# Patient Record
Sex: Female | Born: 1995
Health system: Southern US, Community
[De-identification: ages and names within clinical notes are randomized; demographics above are authoritative.]

## PROBLEM LIST (undated history)

## (undated) DIAGNOSIS — E282 Polycystic ovarian syndrome: Secondary | ICD-10-CM

## (undated) DIAGNOSIS — R000 Tachycardia, unspecified: Secondary | ICD-10-CM

## (undated) DIAGNOSIS — Z309 Encounter for contraceptive management, unspecified: Secondary | ICD-10-CM

## (undated) DIAGNOSIS — T7840XA Allergy, unspecified, initial encounter: Secondary | ICD-10-CM

## (undated) HISTORY — DX: Encounter for contraceptive management, unspecified: Z30.9

## (undated) HISTORY — DX: Allergy, unspecified, initial encounter: T78.40XA

---
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
## 2015-02-14 ENCOUNTER — Other Ambulatory Visit: Payer: Self-pay | Admitting: Adult Health

## 2015-04-15 ENCOUNTER — Ambulatory Visit (INDEPENDENT_AMBULATORY_CARE_PROVIDER_SITE_OTHER): Payer: 59 | Admitting: Adult Health

## 2015-04-15 ENCOUNTER — Encounter: Payer: Self-pay | Admitting: Adult Health

## 2015-04-15 VITALS — BP 140/72 | HR 104 | Ht 63.0 in | Wt 122.5 lb

## 2015-04-15 DIAGNOSIS — Z3041 Encounter for surveillance of contraceptive pills: Secondary | ICD-10-CM | POA: Diagnosis not present

## 2015-04-15 MED ORDER — NORETHIN ACE-ETH ESTRAD-FE 1-20 MG-MCG PO TABS: 1.0000 | ORAL_TABLET | Freq: Every day | ORAL | Status: DC

## 2015-04-15 NOTE — Patient Instructions (Signed)
Continue OCs Use condoms Pap and physical in 1 year

## 2015-04-15 NOTE — Progress Notes (Signed)
Subjective:     Patient ID: Vanessa Weaver, female   DOB: 1995/07/04, 20 y.o.   MRN: [id number]  HPI Vanessa Weaver is a 20 year old white female in for renewal of OCs, she is happy with them and has no complaints.  Review of Systems Patient denies any headaches, hearing loss, fatigue, blurred vision, shortness of breath, chest pain, abdominal pain, problems with bowel movements, urination, or intercourse. No joint pain or mood swings. Reviewed past medical,surgical, social and family history. Reviewed medications and allergies.     Objective:   Physical Exam BP 140/72 mmHg  Pulse 104  Ht  (1.6 m)  Wt 122 lb 8 oz (55.566 kg)  BMI 21.71 kg/m2  LMP 04/12/2015 Skin warm and dry. Neck: mid line trachea, normal thyroid, good ROM, no lymphadenopathy noted. Lungs: clear to ausculation bilaterally. Cardiovascular: regular rate and rhythm.    Assessment:     Contraceptive management    Plan:     Refilled Gildess FE 1-20 take 1 daily disp 1 pack with 12 refills Use condoms Return in 1 year for pap and physical

## 2016-04-18 ENCOUNTER — Ambulatory Visit (INDEPENDENT_AMBULATORY_CARE_PROVIDER_SITE_OTHER): Payer: 59 | Admitting: Adult Health

## 2016-04-18 ENCOUNTER — Encounter: Payer: Self-pay | Admitting: Adult Health

## 2016-04-18 VITALS — BP 118/70 | HR 74 | Ht 63.0 in | Wt 132.5 lb

## 2016-04-18 DIAGNOSIS — Z3041 Encounter for surveillance of contraceptive pills: Secondary | ICD-10-CM | POA: Diagnosis not present

## 2016-04-18 MED ORDER — NORETHIN ACE-ETH ESTRAD-FE 1-20 MG-MCG PO TABS: 1.0000 | ORAL_TABLET | Freq: Every day | ORAL | 12 refills | Status: DC

## 2016-04-18 NOTE — Patient Instructions (Signed)
Pap and physical in 6 months 

## 2016-04-18 NOTE — Progress Notes (Signed)
Subjective:     Patient ID: Vanessa GoingJessica Weaver, female   DOB: [birth year], 21 y.o.   MRN: [id number]  HPI Vanessa Weaver is a 21 year old white female, married in to get refills on birth control pills and she is happy with them.   Review of Systems Patient denies any headaches, hearing loss, fatigue, blurred vision, shortness of breath, chest pain, abdominal pain, problems with bowel movements, urination, or intercourse. No joint pain or mood swings. Reviewed past medical,surgical, social and family history. Reviewed medications and allergies.     Objective:   Physical Exam BP 118/70 (BP Location: Left Arm, Patient Position: Sitting, Cuff Size: Normal)   Pulse 74   Ht 5\' 3"  (1.6 m)   Wt 132 lb 8 oz (60.1 kg)   LMP 04/09/2016 (Approximate)   BMI 23.47 kg/m PHQ 2 score 0. Skin warm and dry.  Lungs: clear to ausculation bilaterally. Cardiovascular: regular rate and rhythm.   Continue OCs, will get pap and physical this summer.  Assessment:     1. Encounter for surveillance of contraceptive pills       Plan:     Meds ordered this encounter  Medications  . norethindrone-ethinyl estradiol (GILDESS FE 1/20) 1-20 MG-MCG tablet    Sig: Take 1 tablet by mouth daily.    Dispense:  28 tablet    Refill:  12    Order Specific Question:   Supervising Provider    Answer:   Vanessa Weaver [[id number]]  Pap and physical in 6 months

## 2016-10-19 ENCOUNTER — Encounter: Payer: Self-pay | Admitting: Adult Health

## 2016-10-19 ENCOUNTER — Ambulatory Visit (INDEPENDENT_AMBULATORY_CARE_PROVIDER_SITE_OTHER): Payer: 59 | Admitting: Adult Health

## 2016-10-19 ENCOUNTER — Other Ambulatory Visit (HOSPITAL_COMMUNITY)
Admission: RE | Admit: 2016-10-19 | Discharge: 2016-10-19 | Disposition: A | Discharge status: Home / Self Care | Payer: 59 | Source: Ambulatory Visit | Attending: Obstetrics & Gynecology | Admitting: Obstetrics & Gynecology

## 2016-10-19 VITALS — BP 100/60 | HR 76 | Ht 64.0 in | Wt 134.0 lb

## 2016-10-19 DIAGNOSIS — Z01419 Encounter for gynecological examination (general) (routine) without abnormal findings: Secondary | ICD-10-CM | POA: Diagnosis present

## 2016-10-19 DIAGNOSIS — Z3041 Encounter for surveillance of contraceptive pills: Secondary | ICD-10-CM | POA: Diagnosis not present

## 2016-10-19 MED ORDER — NORETHIN ACE-ETH ESTRAD-FE 1-20 MG-MCG PO TABS: 1.0000 | ORAL_TABLET | Freq: Every day | ORAL | 12 refills | Status: DC

## 2016-10-19 NOTE — Addendum Note (Signed)
Addended by: Federico FlakeNES, PEGGY A on: 10/19/2016 12:43 PM   Modules accepted: Orders

## 2016-10-19 NOTE — Progress Notes (Signed)
Patient ID: Vanessa Weaver, female   DOB: 07-03-1995, 21 y.o.   MRN: [id number] History of Present Illness:  Vanessa Weaver is a 21 year ld white female, married, G0P0, in for well woman gyn exam and first pap.   Current Medications, Allergies, Past Medical History, Past Surgical History, Family History and Social History were reviewed in Vanessa Weaver Link electronic medical record.     Review of Systems: Patient denies any headaches, hearing loss, fatigue, blurred vision, shortness of breath, chest pain, abdominal pain, problems with bowel movements, urination, or intercourse. No joint pain or mood swings.Happy with OCs    Physical Exam:BP 100/60 (BP Location: Left Arm, Patient Position: Sitting, Cuff Size: Small)   Pulse 76   Ht 5\' 4"  (1.626 m)   Wt 134 lb (60.8 kg)   LMP 09/13/2016   BMI 23.00 kg/m  General:  Well developed, well nourished, no acute distress Skin:  Warm and dry Neck:  Midline trachea, normal thyroid, good ROM, no lymphadenopathy Lungs; Clear to auscultation bilaterally Breast:  No dominant palpable mass, retraction, or nipple discharge Cardiovascular: Regular rate and rhythm Abdomen:  Soft, non tender, no hepatosplenomegaly Pelvic:  External genitalia is normal in appearance, no lesions.  The vagina is normal in appearance. Urethra has no lesions or masses. The cervix is smooth, pap with reflex HPV performed.  Uterus is felt to be normal size, shape, and contour.  No adnexal masses or tenderness noted.Bladder is non tender, no masses felt. Extremities/musculoskeletal:  No swelling or varicosities noted, no clubbing or cyanosis Psych:  No mood changes, alert and cooperative,seems happy PHQ 2 score 0.She declines GC/CHL today.  Impression: 1. Encounter for routine gynecological examination with Papanicolaou smear of cervix   2. Encounter for surveillance of contraceptive pills       Plan: Meds ordered this encounter  Medications  . norethindrone-ethinyl estradiol  (GILDESS FE 1/20) 1-20 MG-MCG tablet    Sig: Take 1 tablet by mouth daily.    Dispense:  28 tablet    Refill:  12    Order Specific Question:   Supervising Provider    Answer:   Duane LopeEURE, LUTHER H [[id number]]  physical in 1 year, pap in 3 if normal

## 2016-10-19 NOTE — Patient Instructions (Signed)
Physical in 1 year Pap in 3 if normal 

## 2016-10-23 LAB — CYTOLOGY - PAP: DIAGNOSIS: NEGATIVE

## 2016-11-24 ENCOUNTER — Telehealth: Payer: Self-pay | Admitting: Adult Health

## 2016-11-24 NOTE — Telephone Encounter (Signed)
Pt states pharmacy told her they couldn't fill birth control until November. I called [facility] and was told the prescription went through fine and pt can pick up later today with a $0 copay. Pt aware and voiced understanding. JSY

## 2016-11-24 NOTE — Telephone Encounter (Signed)
Patient called stating that she needs a new prescription sent to [facility]. Pt states that she went there to get a refill ad they told her to call the office and have [place] write a new one. Pt states that she runs out on Saturday. I let patient know that Victorino Dike is not in the office today. Pt would like another provider to try and fill it. Please contact pt

## 2017-08-23 DIAGNOSIS — J029 Acute pharyngitis, unspecified: Secondary | ICD-10-CM | POA: Diagnosis not present

## 2017-08-23 DIAGNOSIS — F418 Other specified anxiety disorders: Secondary | ICD-10-CM | POA: Diagnosis not present

## 2017-08-23 DIAGNOSIS — F419 Anxiety disorder, unspecified: Secondary | ICD-10-CM | POA: Diagnosis not present

## 2017-09-18 DIAGNOSIS — J180 Bronchopneumonia, unspecified organism: Secondary | ICD-10-CM | POA: Diagnosis not present

## 2017-10-03 DIAGNOSIS — L03019 Cellulitis of unspecified finger: Secondary | ICD-10-CM | POA: Diagnosis not present

## 2017-10-23 ENCOUNTER — Encounter: Payer: Self-pay | Admitting: Adult Health

## 2017-10-23 ENCOUNTER — Ambulatory Visit (INDEPENDENT_AMBULATORY_CARE_PROVIDER_SITE_OTHER): Payer: BLUE CROSS/BLUE SHIELD | Admitting: Adult Health

## 2017-10-23 VITALS — BP 123/79 | HR 97 | Ht 63.75 in | Wt 151.4 lb

## 2017-10-23 DIAGNOSIS — Z3041 Encounter for surveillance of contraceptive pills: Secondary | ICD-10-CM | POA: Diagnosis not present

## 2017-10-23 DIAGNOSIS — Z01419 Encounter for gynecological examination (general) (routine) without abnormal findings: Secondary | ICD-10-CM | POA: Diagnosis not present

## 2017-10-23 MED ORDER — NORETHIN ACE-ETH ESTRAD-FE 1-20 MG-MCG PO TABS: 1.0000 | ORAL_TABLET | Freq: Every day | ORAL | 4 refills | Status: DC

## 2017-10-23 NOTE — Progress Notes (Addendum)
Patient ID: Vanessa Weaver, female   DOB: 03/17/1995, 22 y.o.   MRN: [id number] History of Present Illness: Vanessa Weaver is a 22 year old white female in for a well woman gyn exam, and last pap was normal 10/19/16. She says she is good, no complaints.   Current Medications, Allergies, Past Medical History, Past Surgical History, Family History and Social History were [facility].     Review of Systems: Patient denies any headaches, hearing loss, fatigue, blurred vision, shortness of breath, chest pain, abdominal pain, problems with bowel movements, urination, or intercourse. No joint pain or mood swings.    Physical Exam:BP 123/79 (BP Location: Right Arm, Patient Position: Sitting, Cuff Size: Normal)   Pulse 97   Ht 5' 3.75" (1.619 m)   Wt 151 lb 6.4 oz (68.7 kg)   BMI 26.19 kg/m  General:  Well developed, well nourished, no acute distress Skin:  Warm and dry Neck:  Midline trachea, normal thyroid, good ROM, no lymphadenopathy Lungs; Clear to auscultation bilaterally Breast:  No dominant palpable mass, retraction, or nipple discharge,both nipples inverted Cardiovascular: Regular rate and rhythm Abdomen:  Soft, non tender, no hepatosplenomegaly Pelvic:  External genitalia is normal in appearance, no lesions.  The vagina is normal in appearance. Urethra has no lesions or masses. The cervix is nulliparous.   Uterus is felt to be normal size, shape, and contour.  No adnexal masses or tenderness noted.Bladder is non tender, no masses felt. Extremities/musculoskeletal:  No swelling or varicosities noted, no clubbing or cyanosis Psych:  No mood changes, alert and cooperative,seems happy PHQ 2 score 0. She declines STD testing.  Impression:   ICD-10-CM   1. Encounter for well woman exam with routine gynecological exam Z01.419   2. Encounter for surveillance of contraceptive pills Z30.41       Plan: Meds ordered this encounter  Medications  .  norethindrone-ethinyl estradiol (GILDESS FE 1/20) 1-20 MG-MCG tablet    Sig: Take 1 tablet by mouth daily.    Dispense:  84 tablet    Refill:  4    Order Specific Question:   Supervising Provider    Answer:   Lazaro ArmsEURE, LUTHER H [[id number]]  Physical in 1 years Pap in 2021

## 2018-03-28 DIAGNOSIS — Z008 Encounter for other general examination: Secondary | ICD-10-CM | POA: Diagnosis not present

## 2018-03-28 DIAGNOSIS — F17210 Nicotine dependence, cigarettes, uncomplicated: Secondary | ICD-10-CM | POA: Diagnosis not present

## 2018-04-28 DIAGNOSIS — R070 Pain in throat: Secondary | ICD-10-CM | POA: Diagnosis not present

## 2018-05-09 DIAGNOSIS — F17210 Nicotine dependence, cigarettes, uncomplicated: Secondary | ICD-10-CM | POA: Diagnosis not present

## 2018-05-09 DIAGNOSIS — Z716 Tobacco abuse counseling: Secondary | ICD-10-CM | POA: Diagnosis not present

## 2018-09-13 DIAGNOSIS — M546 Pain in thoracic spine: Secondary | ICD-10-CM | POA: Diagnosis not present

## 2018-10-21 ENCOUNTER — Other Ambulatory Visit: Payer: Self-pay

## 2018-10-21 DIAGNOSIS — Z20822 Contact with and (suspected) exposure to covid-19: Secondary | ICD-10-CM

## 2018-10-22 DIAGNOSIS — J301 Allergic rhinitis due to pollen: Secondary | ICD-10-CM | POA: Diagnosis not present

## 2018-10-22 DIAGNOSIS — J029 Acute pharyngitis, unspecified: Secondary | ICD-10-CM | POA: Diagnosis not present

## 2018-10-22 LAB — NOVEL CORONAVIRUS, NAA: SARS-CoV-2, NAA: NOT DETECTED

## 2018-12-28 DIAGNOSIS — Z760 Encounter for issue of repeat prescription: Secondary | ICD-10-CM | POA: Diagnosis not present

## 2019-02-04 ENCOUNTER — Ambulatory Visit
Admission: EM | Admit: 2019-02-04 | Discharge: 2019-02-04 | Disposition: A | Discharge status: Home / Self Care | Payer: BC Managed Care – PPO | Attending: Emergency Medicine | Admitting: Emergency Medicine

## 2019-02-04 ENCOUNTER — Other Ambulatory Visit: Payer: Self-pay

## 2019-02-04 DIAGNOSIS — Z20822 Contact with and (suspected) exposure to covid-19: Secondary | ICD-10-CM

## 2019-02-04 DIAGNOSIS — R112 Nausea with vomiting, unspecified: Secondary | ICD-10-CM

## 2019-02-04 DIAGNOSIS — A084 Viral intestinal infection, unspecified: Secondary | ICD-10-CM

## 2019-02-04 MED ORDER — ONDANSETRON 4 MG PO TBDP
4.0000 mg | ORAL_TABLET | Freq: Once | ORAL | Status: AC
  Administered 2019-02-04: 4 mg via ORAL

## 2019-02-04 MED ORDER — ONDANSETRON HCL 4 MG PO TABS: 4.0000 mg | ORAL_TABLET | Freq: Four times a day (QID) | ORAL | 0 refills | Status: DC

## 2019-02-04 NOTE — ED Provider Notes (Signed)
[facility]   [id number] 02/04/19 Arrival Time: 1234  CC: nausea, vomiting, diarrhea  SUBJECTIVE:  Vanessa Weaver is a 23 y.o. female who presents with complaint of nausea, 1 episode of vomiting, and greater than 5 episodes of watery/ loose stools that began 1 day ago.  Denies a precipitating event, trauma, close contacts with similar symptoms, recent travel or antibiotic use.  Admits to associated abdominal discomfort.  Has not tried OTC medications.  Denies alleviating or aggravating factors.  Has been able to keep water and liquids down without difficulty.  Denies fever, chills, chest pain, SOB, constipation, hematochezia, melena, dysuria, difficulty urinating, increased frequency or urgency, flank pain, loss of bowel or bladder function.  Patient's last menstrual period was 01/16/2019. Trying to get pregnant.  Took pregnancy test yesterday and was negative.    ROS: As per HPI.  All other pertinent ROS negative.     Past Medical History:  Diagnosis Date  . Allergy   . Contraceptive management 10/28/2013   History reviewed. No pertinent surgical history. No Known Allergies No current facility-administered medications on file prior to encounter.    Current Outpatient Medications on File Prior to Encounter  Medication Sig Dispense Refill  . hydrOXYzine (ATARAX/VISTARIL) 25 MG tablet Take 25 mg by mouth as needed.   2  . sertraline (ZOLOFT) 50 MG tablet TK 1 T PO QD UTD  6  . [DISCONTINUED] norethindrone-ethinyl estradiol (GILDESS FE 1/20) 1-20 MG-MCG tablet Take 1 tablet by mouth daily. 84 tablet 4   Social History   Socioeconomic History  . Marital status: Single    Spouse name: Not on file  . Number of children: Not on file  . Years of education: Not on file  . Highest education level: Not on file  Occupational History  . Not on file  Social Needs  . Financial resource strain: Not on file  . Food insecurity    Worry: Not on file    Inability: Not on file  .  Transportation needs    Medical: Not on file    Non-medical: Not on file  Tobacco Use  . Smoking status: Current Every Day Smoker    Years: 3.00    Types: E-cigarettes  . Smokeless tobacco: Never Used  Substance and Sexual Activity  . Alcohol use: Yes    Comment: occ  . Drug use: No  . Sexual activity: Yes    Birth control/protection: Pill  Lifestyle  . Physical activity    Days per week: Not on file    Minutes per session: Not on file  . Stress: Not on file  Relationships  . Social Musician on phone: Not on file    Gets together: Not on file    Attends religious service: Not on file    Active member of club or organization: Not on file    Attends meetings of clubs or organizations: Not on file    Relationship status: Not on file  . Intimate partner violence    Fear of current or ex partner: Not on file    Emotionally abused: Not on file    Physically abused: Not on file    Forced sexual activity: Not on file  Other Topics Concern  . Not on file  Social History Narrative  . Not on file   Family History  Problem Relation Age of Onset  . Hypertension Father   . Stroke Father   . Kidney failure Father   .  Other Father        liver failure  . Cancer Maternal Grandmother        stomach  . Diabetes Maternal Grandfather   . Heart disease Maternal Grandfather   . Alzheimer's disease Paternal Grandfather      OBJECTIVE:  Vitals:   02/04/19 1248  BP: 125/79  Pulse: 100  Resp: 18  Temp: 98 F (36.7 C)  SpO2: 98%    General appearance: Alert; NAD HEENT: NCAT.  PERRL, EOMI grossly; Oropharynx clear.  Lungs: clear to auscultation bilaterally without adventitious breath sounds Heart: regular rate and rhythm.   Abdomen: soft, non-distended; normal active bowel sounds; non-tender to light and deep palpation; nontender at McBurney's point; negative Murphy's sign; no guarding Extremities: no edema; symmetrical with no gross deformities Skin: warm and dry  Neurologic: normal gait Psychological: alert and cooperative; normal mood and affect  ASSESSMENT & PLAN:  1. Viral gastroenteritis   2. Suspected COVID-19 virus infection   3. Non-intractable vomiting with nausea, unspecified vomiting type     Meds ordered this encounter  Medications  . ondansetron (ZOFRAN) 4 MG tablet    Sig: Take 1 tablet (4 mg total) by mouth every 6 (six) hours.    Dispense:  12 tablet    Refill:  0    Order Specific Question:   Supervising Provider    Answer:   Raylene Everts [[id number]]  . ondansetron (ZOFRAN-ODT) disintegrating tablet 4 mg    Declines pregnancy test Declines COVID.  Will go to drive-thru or return if COVID test needed for work  In the meantime: You should remain isolated in your home for 10 days from symptom onset AND greater than 72 hours after symptoms resolution (absence of fever without the use of fever-reducing medication and improvement in respiratory symptoms), whichever is longer Get plenty of rest and push fluids.  You may supplement with OTC pedialyte as needed Zofran prescribed for nausea as needed Use OTC medications like ibuprofen or tylenol as needed fever or pain Follow up with PCP as needed Call or go to the ED if you have any new or worsening symptoms such as fever, cough, shortness of breath, chest tightness, chest pain, turning blue, changes in mental status, profuse watery diarrhea, persistent vomiting, inability to keep fluids down, medication does not relieve symptoms, etc...   Reviewed expectations re: course of current medical issues. Questions answered. Outlined signs and symptoms indicating need for more acute intervention. Patient verbalized understanding. After Visit Summary given.   Lestine Box, PA-C 02/04/19 1326

## 2019-02-04 NOTE — ED Triage Notes (Signed)
Pt presents with c/o nausea and some dry heaves that stared yesterday , pt also having diarrhea

## 2019-02-04 NOTE — Discharge Instructions (Signed)
Declines pregnancy test Declines COVID.  Will go to drive-thru or return if COVID test needed for work  In the meantime: You should remain isolated in your home for 10 days from symptom onset AND greater than 72 hours after symptoms resolution (absence of fever without the use of fever-reducing medication and improvement in respiratory symptoms), whichever is longer Get plenty of rest and push fluids.  You may supplement with OTC pedialyte as needed Zofran prescribed for nausea as needed Use OTC medications like ibuprofen or tylenol as needed fever or pain Follow up with PCP as needed Call or go to the ED if you have any new or worsening symptoms such as fever, cough, shortness of breath, chest tightness, chest pain, turning blue, changes in mental status, profuse watery diarrhea, persistent vomiting, inability to keep fluids down, medication does not relieve symptoms, etc..Marland Kitchen

## 2019-02-05 ENCOUNTER — Ambulatory Visit
Admission: EM | Admit: 2019-02-05 | Discharge: 2019-02-05 | Disposition: A | Discharge status: Home / Self Care | Payer: BC Managed Care – PPO | Attending: Emergency Medicine | Admitting: Emergency Medicine

## 2019-02-05 DIAGNOSIS — Z20828 Contact with and (suspected) exposure to other viral communicable diseases: Secondary | ICD-10-CM | POA: Diagnosis not present

## 2019-02-05 DIAGNOSIS — Z20822 Contact with and (suspected) exposure to covid-19: Secondary | ICD-10-CM

## 2019-02-05 NOTE — ED Triage Notes (Signed)
Pt here for covid test after initial visit

## 2019-02-07 LAB — NOVEL CORONAVIRUS, NAA: SARS-CoV-2, NAA: NOT DETECTED

## 2019-02-16 DIAGNOSIS — J069 Acute upper respiratory infection, unspecified: Secondary | ICD-10-CM | POA: Diagnosis not present

## 2019-04-23 ENCOUNTER — Other Ambulatory Visit: Payer: Self-pay

## 2019-04-23 ENCOUNTER — Other Ambulatory Visit (HOSPITAL_COMMUNITY)
Admission: RE | Admit: 2019-04-23 | Discharge: 2019-04-23 | Disposition: A | Discharge status: Home / Self Care | Payer: BC Managed Care – PPO | Source: Ambulatory Visit | Attending: Adult Health | Admitting: Adult Health

## 2019-04-23 ENCOUNTER — Encounter: Payer: Self-pay | Admitting: Adult Health

## 2019-04-23 ENCOUNTER — Ambulatory Visit (INDEPENDENT_AMBULATORY_CARE_PROVIDER_SITE_OTHER): Payer: BC Managed Care – PPO | Admitting: Adult Health

## 2019-04-23 VITALS — BP 141/87 | HR 114 | Ht 62.5 in | Wt 143.5 lb

## 2019-04-23 DIAGNOSIS — Z01419 Encounter for gynecological examination (general) (routine) without abnormal findings: Secondary | ICD-10-CM | POA: Diagnosis not present

## 2019-04-23 DIAGNOSIS — Z319 Encounter for procreative management, unspecified: Secondary | ICD-10-CM | POA: Insufficient documentation

## 2019-04-23 NOTE — Progress Notes (Signed)
Patient ID: Vanessa Weaver, female   DOB: 09-Aug-1995, 24 y.o.   MRN: [id number] History of Present Illness: Vanessa Weaver is a 24 year old white female, married, G0P0, off birth control about a year now, would like to get pregnant.    Current Medications, Allergies, Past Medical History, Past Surgical History, Family History and Social History were reviewed in [employer] record.     Review of Systems: Patient denies any headaches, hearing loss, fatigue, blurred vision, shortness of breath, chest pain, abdominal pain, problems with bowel movements, urination, or intercourse. No joint pain or mood swings. Periods are regular    Physical Exam:BP (!) 141/87 (BP Location: Left Arm, Patient Position: Sitting, Cuff Size: Normal)   Pulse (!) 114   Ht 5' 2.5" (1.588 m)   Wt 143 lb 8 oz (65.1 kg)   LMP 04/15/2019   BMI 25.83 kg/m  General:  Well developed, well nourished, no acute distress Skin:  Warm and dry,has lots of tattoos Neck:  Midline trachea, normal thyroid, good ROM, no lymphadenopathy Lungs; Clear to auscultation bilaterally Breast:  No dominant palpable mass, retraction, or nipple discharge,both nipples inverted  Cardiovascular: Regular rate and rhythm Abdomen:  Soft, non tender, no hepatosplenomegaly Pelvic:  External genitalia is normal in appearance, no lesions.  The vagina is normal in appearance. Urethra has no lesions or masses. The cervix is nulliparous, pap with GC/CHL performed.  Uterus is felt to be normal size, shape, and contour.  No adnexal masses or tenderness noted.Bladder is non tender, no masses felt. Extremities/musculoskeletal:  No swelling or varicosities noted, no clubbing or cyanosis Psych:  No mood changes, alert and cooperative,seems happy Fall risk is low PHQ 2 score is 0. Examination chaperoned by Stoney Bang LPN  Impression and plan:  1. Encounter for gynecological examination with Papanicolaou smear of cervix Pap sent  Physical  in 1 year Pap in 3 if normal  2. Patient desires pregnancy Continue PNV Have sex every other day 7-24 Pt aware could take 6-18 months of active trying Pee before sex and lay there after

## 2019-04-25 LAB — CYTOLOGY - PAP
Chlamydia: NEGATIVE
Comment: NEGATIVE
Comment: NORMAL
Diagnosis: NEGATIVE
Neisseria Gonorrhea: NEGATIVE

## 2019-05-08 DIAGNOSIS — H1030 Unspecified acute conjunctivitis, unspecified eye: Secondary | ICD-10-CM | POA: Diagnosis not present

## 2019-05-09 ENCOUNTER — Ambulatory Visit
Admission: EM | Admit: 2019-05-09 | Discharge: 2019-05-09 | Disposition: A | Discharge status: Home / Self Care | Payer: BC Managed Care – PPO | Attending: Emergency Medicine | Admitting: Emergency Medicine

## 2019-05-09 ENCOUNTER — Other Ambulatory Visit: Payer: Self-pay

## 2019-05-09 DIAGNOSIS — M25511 Pain in right shoulder: Secondary | ICD-10-CM | POA: Diagnosis not present

## 2019-05-09 DIAGNOSIS — G8929 Other chronic pain: Secondary | ICD-10-CM

## 2019-05-09 MED ORDER — IBUPROFEN 800 MG PO TABS: 800.0000 mg | ORAL_TABLET | Freq: Three times a day (TID) | ORAL | 0 refills | Status: DC

## 2019-05-09 MED ORDER — PREDNISONE 10 MG PO TABS: 20.0000 mg | ORAL_TABLET | Freq: Every day | ORAL | 0 refills | Status: DC

## 2019-05-09 NOTE — Discharge Instructions (Signed)
Advised patient to take medication as prescribed Follow RICE instruction as attached Follow-up with primary care To return or go to ED for worsening of symptoms

## 2019-05-09 NOTE — ED Triage Notes (Signed)
Pt presents with c/o right shoulder pain that she has had for over a year, unknown if injury

## 2019-05-09 NOTE — ED Provider Notes (Signed)
[facility]    CSN: [id number] Arrival date & time: 05/09/19  0903      History   Chief Complaint Chief Complaint  Patient presents with  . Shoulder Pain    HPI Vanessa Weaver is a 24 y.o. female.   Who presented to the urgent care for complaint of right shoulder pain for  more than a year.  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  She localizes the pain to the right shoulder around her shoulder blade.  She describes the pain as intermittent and achy.  Rated at 5 on a scale of 1-10.  She reports she visited urgent care on Friday the past and was prescribed ibuprofen with mild relief.  Her symptoms are made worse with ROM.  Denies trauma, injury, fall or accident.     Past Medical History:  Diagnosis Date  . Allergy   . Contraceptive management 10/28/2013    Patient Active Problem List   Diagnosis Date Noted  . Encounter for gynecological examination with Papanicolaou smear of cervix 04/23/2019  . Patient desires pregnancy 04/23/2019  . Encounter for routine gynecological examination with Papanicolaou smear of cervix 10/19/2016  . Encounter for surveillance of contraceptive pills 04/18/2016  . Contraceptive management 10/28/2013    History reviewed. No pertinent surgical history.  OB History    Gravida  0   Para      Term      Preterm      AB      Living        SAB      TAB      Ectopic      Multiple      Live Births               Home Medications    Prior to Admission medications   Medication Sig Start Date End Date Taking? Authorizing Provider  hydrOXYzine (ATARAX/VISTARIL) 25 MG tablet Take 25 mg by mouth as needed.  08/23/17   [provider]  ibuprofen (ADVIL) 800 MG tablet Take 1 tablet (800 mg total) by mouth 3 (three) times daily. 05/09/19   Conswella Bruney, Zachery Dakins, FNP  predniSONE (DELTASONE) 10 MG tablet Take 2 tablets (20 mg total) by mouth daily. 05/09/19   Kamyiah Colantonio, Zachery Dakins, FNP  Prenatal Vit-Fe Fumarate-FA (PRENATAL VITAMIN PO) Take by mouth daily.    [provider]  sertraline (ZOLOFT) 50 MG tablet TK 1 T PO QD UTD 09/25/17   [provider]  norethindrone-ethinyl estradiol (GILDESS FE 1/20) 1-20 MG-MCG tablet Take 1 tablet by mouth daily. 10/23/17 02/04/19  Adline Potter, NP    Family History Family History  Problem Relation Age of Onset  . Hypertension Father   . Stroke Father   . Kidney failure Father   . Other Father        liver failure  . Cancer Maternal Grandmother        stomach  . Diabetes Maternal Grandfather   . Heart disease Maternal Grandfather   . Alzheimer's disease Paternal Grandfather     Social History Social History   Tobacco Use  . Smoking status: Former Smoker    Years: 3.00    Types: E-cigarettes  . Smokeless tobacco: Never Used  Substance Use Topics  . Alcohol use: Yes    Comment: occ  . Drug use: No     Allergies   Patient has no known allergies.  Review of Systems Review of Systems  Constitutional: Negative.   Respiratory: Negative.   Cardiovascular: Negative.   Musculoskeletal:       Shoulder pain  All other systems reviewed and are negative.    Physical Exam Triage Vital Signs ED Triage Vitals  Enc Vitals Group     BP 05/09/19 0912 124/81     Pulse Rate 05/09/19 0912 (!) 112     Resp 05/09/19 0912 18     Temp 05/09/19 0912 98.3 F (36.8 C)     Temp src --      SpO2 05/09/19 0912 99 %     Weight --      Height --      Head Circumference --      Peak Flow --      Pain Score 05/09/19 0910 5     Pain Loc --      Pain Edu? --      Excl. in GC? --    No data found.  Updated Vital Signs BP 124/81   Pulse (!) 112   Temp 98.3 F (36.8 C)   Resp 18   LMP 04/15/2019   SpO2 99%   Visual Acuity Right Eye Distance:   Left Eye Distance:   Bilateral Distance:    Right Eye Near:   Left Eye Near:    Bilateral Near:     Physical Exam Vitals and nursing note  reviewed.  Constitutional:      General: She is not in acute distress.    Appearance: Normal appearance. She is normal weight. She is not ill-appearing or toxic-appearing.  Cardiovascular:     Rate and Rhythm: Normal rate and regular rhythm.     Pulses: Normal pulses.     Heart sounds: Normal heart sounds. No murmur. No gallop.   Pulmonary:     Effort: Pulmonary effort is normal. No respiratory distress.     Breath sounds: Normal breath sounds. No stridor. No wheezing, rhonchi or rales.  Chest:     Chest wall: No tenderness.  Musculoskeletal:        General: Tenderness present. No swelling, deformity or signs of injury. Normal range of motion.     Right shoulder: Tenderness present. No deformity, effusion, laceration, bony tenderness or crepitus. Normal range of motion.     Left shoulder: Normal.     Right lower leg: No edema.     Left lower leg: No edema.     Comments: The right shoulder is without any obvious asymmetry or deformity when compared to the left.  No erythema, atrophy or obvious deformity.  No surface trauma, open wound present.  Tenderness present on range of motion.  Neurological:     General: No focal deficit present.     Mental Status: She is alert and oriented to person, place, and time.     Cranial Nerves: No cranial nerve deficit.     Sensory: No sensory deficit.     Motor: No weakness.     Coordination: Coordination normal.     Gait: Gait normal.     Deep Tendon Reflexes: Reflexes normal.      UC Treatments / Results  Labs (all labs ordered are listed, but only abnormal results are displayed) Labs Reviewed - No data to display  EKG   Radiology No results found.  Procedures Procedures (including critical care time)  Medications Ordered in UC Medications - No data to display  Initial Impression / Assessment and Plan /  UC Course  I have reviewed the triage vital signs and the nursing notes.  Pertinent labs & imaging results that were available  during my care of the patient were reviewed by me and considered in my medical decision making (see chart for details).    Patient stable for discharge. Ibuprofen was prescribed for pain Prednisone as prescribed for inflammation Follow-up with primary care Return for worsening symptoms  Final Clinical Impressions(s) / UC Diagnoses   Final diagnoses:  Chronic right shoulder pain     Discharge Instructions     Advised patient to take medication as prescribed Follow RICE instruction as attached Follow-up with primary care To return or go to ED for worsening of symptoms    ED Prescriptions    Medication Sig Dispense Auth. Provider   ibuprofen (ADVIL) 800 MG tablet Take 1 tablet (800 mg total) by mouth 3 (three) times daily. 42 tablet Senan Urey S, FNP   predniSONE (DELTASONE) 10 MG tablet Take 2 tablets (20 mg total) by mouth daily. 15 tablet Delorus Langwell, Darrelyn Hillock, FNP     PDMP not reviewed this encounter.   Emerson Monte, [place] 05/09/19 [phone]

## 2019-06-26 DIAGNOSIS — Z760 Encounter for issue of repeat prescription: Secondary | ICD-10-CM | POA: Diagnosis not present

## 2019-07-30 DIAGNOSIS — K529 Noninfective gastroenteritis and colitis, unspecified: Secondary | ICD-10-CM | POA: Diagnosis not present

## 2019-08-29 DIAGNOSIS — H6990 Unspecified Eustachian tube disorder, unspecified ear: Secondary | ICD-10-CM | POA: Diagnosis not present

## 2019-08-29 DIAGNOSIS — R0982 Postnasal drip: Secondary | ICD-10-CM | POA: Diagnosis not present

## 2019-08-29 DIAGNOSIS — J069 Acute upper respiratory infection, unspecified: Secondary | ICD-10-CM | POA: Diagnosis not present

## 2019-08-29 DIAGNOSIS — R0981 Nasal congestion: Secondary | ICD-10-CM | POA: Diagnosis not present

## 2019-09-21 ENCOUNTER — Other Ambulatory Visit: Payer: Self-pay

## 2019-09-21 ENCOUNTER — Ambulatory Visit
Admission: EM | Admit: 2019-09-21 | Discharge: 2019-09-21 | Disposition: A | Discharge status: Home / Self Care | Payer: BC Managed Care – PPO | Attending: Emergency Medicine | Admitting: Emergency Medicine

## 2019-09-21 ENCOUNTER — Encounter: Payer: Self-pay | Admitting: Emergency Medicine

## 2019-09-21 ENCOUNTER — Ambulatory Visit (INDEPENDENT_AMBULATORY_CARE_PROVIDER_SITE_OTHER): Payer: BC Managed Care – PPO

## 2019-09-21 DIAGNOSIS — J069 Acute upper respiratory infection, unspecified: Secondary | ICD-10-CM | POA: Diagnosis not present

## 2019-09-21 DIAGNOSIS — R05 Cough: Secondary | ICD-10-CM | POA: Diagnosis not present

## 2019-09-21 DIAGNOSIS — R0989 Other specified symptoms and signs involving the circulatory and respiratory systems: Secondary | ICD-10-CM

## 2019-09-21 DIAGNOSIS — Z760 Encounter for issue of repeat prescription: Secondary | ICD-10-CM | POA: Diagnosis not present

## 2019-09-21 MED ORDER — SERTRALINE HCL 50 MG PO TABS: ORAL_TABLET | ORAL | 1 refills | Status: DC

## 2019-09-21 MED ORDER — BENZONATATE 100 MG PO CAPS: 100.0000 mg | ORAL_CAPSULE | Freq: Three times a day (TID) | ORAL | 0 refills | Status: DC

## 2019-09-21 NOTE — ED Triage Notes (Signed)
Was treated for sinus infection 2 weeks ago.  Finished antibiotics.  Still having chest congestion and sore throat.

## 2019-09-21 NOTE — Discharge Instructions (Signed)
Chest x-ray negative for cardiopulmonary disease Symptoms most likely viral Get plenty of rest and push fluids Tessalon Perles prescribed for cough Zyrtec for nasal congestion, runny nose, and/or sore throat Flonase for nasal congestion and runny nose Use medications daily for symptom relief Use OTC medications like ibuprofen or tylenol as needed fever or pain Call or go to the ED if you have any new or worsening symptoms such as fever, worsening cough, shortness of breath, chest tightness, chest pain, turning blue, changes in mental status, etc...   Sertraline refill Follow up with PCP for future refills

## 2019-09-21 NOTE — ED Provider Notes (Signed)
[facility]   [id number] 09/21/19 Arrival Time: 1337   CC: URI  SUBJECTIVE: History from: patient.  Vanessa Weaver is a 24 y.o. female who presents with runny nose, sore throat, and dry cough x 1 week.  Denies sick exposure to COVID, flu or strep.  Denies recent travel.  Recently treated for sinus infection with antibiotic, reports temporary relief in symptoms.   Denies aggravating factors.  Reports previous symptoms in the past with walking pneumonia.   Denies fever, chills, fatigue, sinus pain, SOB, wheezing, chest pain, nausea, changes in bowel or bladder habits.    Also requested medication refill for sertraline for depression.  Does not have a PCP.  Last refill done at Urgent care in .    ROS: As per HPI.  All other pertinent ROS negative.     Past Medical History:  Diagnosis Date  . Allergy   . Contraceptive management 10/28/2013   History reviewed. No pertinent surgical history. No Known Allergies No current facility-administered medications on file prior to encounter.   Current Outpatient Medications on File Prior to Encounter  Medication Sig Dispense Refill  . hydrOXYzine (ATARAX/VISTARIL) 25 MG tablet Take 25 mg by mouth as needed.   2  . Prenatal Vit-Fe Fumarate-FA (PRENATAL VITAMIN PO) Take by mouth daily.    . [DISCONTINUED] norethindrone-ethinyl estradiol (GILDESS FE 1/20) 1-20 MG-MCG tablet Take 1 tablet by mouth daily. 84 tablet 4   Social History   Socioeconomic History  . Marital status: Married    Spouse name: Not on file  . Number of children: Not on file  . Years of education: Not on file  . Highest education level: Not on file  Occupational History  . Not on file  Tobacco Use  . Smoking status: Former Smoker    Years: 3.00    Types: E-cigarettes  . Smokeless tobacco: Never Used  Vaping Use  . Vaping Use: Former  Substance and Sexual Activity  . Alcohol use: Yes    Comment: occ  . Drug use: No  . Sexual activity: Yes     Birth control/protection: None  Other Topics Concern  . Not on file  Social History Narrative  . Not on file   Social Determinants of Health   Financial Resource Strain:   . Difficulty of Paying Living Expenses:   Food Insecurity:   . Worried About Programme researcher, broadcasting/film/video in the Last Year:   . Barista in the Last Year:   Transportation Needs:   . Freight forwarder (Medical):   Marland Kitchen Lack of Transportation (Non-Medical):   Physical Activity:   . Days of Exercise per Week:   . Minutes of Exercise per Session:   Stress:   . Feeling of Stress :   Social Connections:   . Frequency of Communication with Friends and Family:   . Frequency of Social Gatherings with Friends and Family:   . Attends Religious Services:   . Active Member of Clubs or Organizations:   . Attends Banker Meetings:   Marland Kitchen Marital Status:   Intimate Partner Violence:   . Fear of Current or Ex-Partner:   . Emotionally Abused:   Marland Kitchen Physically Abused:   . Sexually Abused:    Family History  Problem Relation Age of Onset  . Hypertension Father   . Stroke Father   . Kidney failure Father   . Other Father        liver failure  . Cancer Maternal  Grandmother        stomach  . Diabetes Maternal Grandfather   . Heart disease Maternal Grandfather   . Alzheimer's disease Paternal Grandfather     OBJECTIVE:  Vitals:   09/21/19 1346 09/21/19 1347  BP: 108/74   Pulse: 95   Resp: 18   Temp: 98 F (36.7 C)   TempSrc: Oral   SpO2: 97%   Weight:  140 lb (63.5 kg)    General appearance: alert; mildly fatigued, nontoxic; speaking in full sentences and tolerating own secretions HEENT: NCAT; Ears: EACs clear, TMs pearly gray; Eyes: PERRL.  EOM grossly intact. Nose: nares patent without rhinorrhea, Throat: oropharynx clear, tonsils non erythematous or enlarged, uvula midline  Neck: supple without LAD Lungs: unlabored respirations, symmetrical air entry; cough: mild to moderate; no respiratory  distress; CTAB Heart: regular rate and rhythm.  Skin: warm and dry Psychological: alert and cooperative; normal mood and affect   DIAGNOSTIC STUDIES:  DG Chest 2 View  Result Date: 09/21/2019 CLINICAL DATA:  Cough and chest congestion EXAM: CHEST - 2 VIEW COMPARISON:  None. FINDINGS: The heart size and mediastinal contours are within normal limits. Both lungs are clear. The visualized skeletal structures are unremarkable. IMPRESSION: No active cardiopulmonary disease. Electronically Signed   By: Gerome Sam III M.D   On: 09/21/2019 14:13    X-rays negative for cardiopulmonary disease  I have reviewed the x-rays myself and the radiologist interpretation. I am in agreement with the radiologist interpretation.     ASSESSMENT & PLAN:  1. Viral URI with cough   2. Medication refill     Meds ordered this encounter  Medications  . sertraline (ZOLOFT) 50 MG tablet    Sig: TK 1 T PO QD UTD    Dispense:  30 tablet    Refill:  1    Order Specific Question:   Supervising Provider    Answer:   Eustace Moore [[id number]]  . benzonatate (TESSALON) 100 MG capsule    Sig: Take 1 capsule (100 mg total) by mouth every 8 (eight) hours.    Dispense:  21 capsule    Refill:  0    Order Specific Question:   Supervising Provider    Answer:   Eustace Moore [[id number]]   Chest x-ray negative for cardiopulmonary disease Symptoms most likely viral Get plenty of rest and push fluids Tessalon Perles prescribed for cough Zyrtec for nasal congestion, runny nose, and/or sore throat Flonase for nasal congestion and runny nose Use medications daily for symptom relief Use OTC medications like ibuprofen or tylenol as needed fever or pain Call or go to the ED if you have any new or worsening symptoms such as fever, worsening cough, shortness of breath, chest tightness, chest pain, turning blue, changes in mental status, etc...   Sertraline refill Follow up with PCP for future refills  Reviewed  expectations re: course of current medical issues. Questions answered. Outlined signs and symptoms indicating need for more acute intervention. Patient verbalized understanding. After Visit Summary given.         Rennis Harding, PA-C 09/21/19 1442

## 2019-09-24 LAB — CULTURE, GROUP A STREP (THRC)

## 2019-12-10 ENCOUNTER — Ambulatory Visit
Admission: EM | Admit: 2019-12-10 | Discharge: 2019-12-10 | Disposition: A | Discharge status: Home / Self Care | Payer: BC Managed Care – PPO | Attending: Emergency Medicine | Admitting: Emergency Medicine

## 2019-12-10 ENCOUNTER — Other Ambulatory Visit: Payer: Self-pay

## 2019-12-10 DIAGNOSIS — R112 Nausea with vomiting, unspecified: Secondary | ICD-10-CM | POA: Diagnosis not present

## 2019-12-10 MED ORDER — ONDANSETRON 4 MG PO TBDP: 4.0000 mg | ORAL_TABLET | Freq: Three times a day (TID) | ORAL | 0 refills | Status: DC | PRN

## 2019-12-10 NOTE — ED Triage Notes (Signed)
Pt presents with c/o vomiting that began last night , no other symptoms, denies pain

## 2019-12-10 NOTE — Discharge Instructions (Addendum)

## 2019-12-10 NOTE — ED Provider Notes (Addendum)
[facility]   [id number] 12/10/19 Arrival Time: 1105  CC: ABDOMINAL DISCOMFORT  SUBJECTIVE:  Vanessa Weaver is a 24 y.o. female who presents to the urgent care for complaint of vomiting that started last night.. Her husband with the same symptom after eating a red meat barbecue.  Denies abdominal/flank pain.  Has not tried any OTC medication.  Denies alleviating or aggravating factors.  Denies similar symptoms in the past.  Denies fever, chills, appetite changes, weight changes,  chest pain, SOB, diarrhea, constipation, hematochezia, melena, dysuria, difficulty urinating, increased frequency or urgency, flank pain, loss of bowel or bladder function, vaginal discharge, vaginal odor, vaginal bleeding, dyspareunia, pelvic pain.     Patient's last menstrual period was 12/09/2019.  ROS: As per HPI.  All other pertinent ROS negative.     Past Medical History:  Diagnosis Date  . Allergy   . Contraceptive management 10/28/2013   No past surgical history on file. No Known Allergies No current facility-administered medications on file prior to encounter.   Current Outpatient Medications on File Prior to Encounter  Medication Sig Dispense Refill  . benzonatate (TESSALON) 100 MG capsule Take 1 capsule (100 mg total) by mouth every 8 (eight) hours. 21 capsule 0  . hydrOXYzine (ATARAX/VISTARIL) 25 MG tablet Take 25 mg by mouth as needed.   2  . Prenatal Vit-Fe Fumarate-FA (PRENATAL VITAMIN PO) Take by mouth daily.    . sertraline (ZOLOFT) 50 MG tablet TK 1 T PO QD UTD 30 tablet 1  . [DISCONTINUED] norethindrone-ethinyl estradiol (GILDESS FE 1/20) 1-20 MG-MCG tablet Take 1 tablet by mouth daily. 84 tablet 4   Social History   Socioeconomic History  . Marital status: Married    Spouse name: Not on file  . Number of children: Not on file  . Years of education: Not on file  . Highest education level: Not on file  Occupational History  . Not on file  Tobacco Use  . Smoking status:  Former Smoker    Years: 3.00    Types: E-cigarettes  . Smokeless tobacco: Never Used  Vaping Use  . Vaping Use: Former  Substance and Sexual Activity  . Alcohol use: Yes    Comment: occ  . Drug use: No  . Sexual activity: Yes    Birth control/protection: None  Other Topics Concern  . Not on file  Social History Narrative  . Not on file   Social Determinants of Health   Financial Resource Strain:   . Difficulty of Paying Living Expenses: Not on file  Food Insecurity:   . Worried About Programme researcher, broadcasting/film/video in the Last Year: Not on file  . Ran Out of Food in the Last Year: Not on file  Transportation Needs:   . Lack of Transportation (Medical): Not on file  . Lack of Transportation (Non-Medical): Not on file  Physical Activity:   . Days of Exercise per Week: Not on file  . Minutes of Exercise per Session: Not on file  Stress:   . Feeling of Stress : Not on file  Social Connections:   . Frequency of Communication with Friends and Family: Not on file  . Frequency of Social Gatherings with Friends and Family: Not on file  . Attends Religious Services: Not on file  . Active Member of Clubs or Organizations: Not on file  . Attends Banker Meetings: Not on file  . Marital Status: Not on file  Intimate Partner Violence:   . Fear of  Current or Ex-Partner: Not on file  . Emotionally Abused: Not on file  . Physically Abused: Not on file  . Sexually Abused: Not on file   Family History  Problem Relation Age of Onset  . Hypertension Father   . Stroke Father   . Kidney failure Father   . Other Father        liver failure  . Cancer Maternal Grandmother        stomach  . Diabetes Maternal Grandfather   . Heart disease Maternal Grandfather   . Alzheimer's disease Paternal Grandfather      OBJECTIVE:  Vitals:   12/10/19 1116  BP: 105/71  Pulse: 78  Resp: 18  Temp: 98.3 F (36.8 C)  SpO2: 98%    General appearance: Alert; NAD HEENT: NCAT.  Oropharynx  clear.  Lungs: clear to auscultation bilaterally without adventitious breath sounds Heart: regular rate and rhythm.  Radial pulses 2+ symmetrical bilaterally Abdomen: soft, non-distended; normal active bowel sounds; non-tender to light and deep palpation; nontender at McBurney's point; negative Murphy's sign; negative rebound; no guarding Back: no CVA tenderness Extremities: no edema; symmetrical with no gross deformities Skin: warm and dry Neurologic: normal gait Psychological: alert and cooperative; normal mood and affect  LABS: No results found for this or any previous visit (from the past 24 hour(s)).  DIAGNOSTIC STUDIES: No results found.   ASSESSMENT & PLAN:  1. Non-intractable vomiting with nausea, unspecified vomiting type     Meds ordered this encounter  Medications  . ondansetron (ZOFRAN ODT) 4 MG disintegrating tablet    Sig: Take 1 tablet (4 mg total) by mouth every 8 (eight) hours as needed for nausea or vomiting.    Dispense:  20 tablet    Refill:  0     Get rest and drink fluids Zofran prescribed.  Take as directed.    DIET Instructions:  30 minutes after taking nausea medicine, begin with sips of clear liquids. If able to hold down 2 - 4 ounces for 30 minutes, begin drinking more. Increase your fluid intake to replace losses. Clear liquids only for 24 hours (water, tea, sport drinks, clear flat ginger ale or cola and juices, broth, jello, popsicles, ect). Advance to bland foods, applesauce, rice, baked or boiled chicken, ect. Avoid milk, greasy foods and anything that doesn't agree with you.  If you experience new or worsening symptoms return or go to ER such as fever, chills, nausea, vomiting, diarrhea, bloody or dark tarry stools, constipation, urinary symptoms, worsening abdominal discomfort, symptoms that do not improve with medications, inability to keep fluids down, etc...  Reviewed expectations re: course of current medical issues. Questions  answered. Outlined signs and symptoms indicating need for more acute intervention. Patient verbalized understanding. After Visit Summary given.   Durward Parcel, FNP 12/10/19 1128    Durward Parcel, FNP 12/10/19 1129

## 2019-12-18 DIAGNOSIS — F331 Major depressive disorder, recurrent, moderate: Secondary | ICD-10-CM | POA: Diagnosis not present

## 2019-12-18 DIAGNOSIS — Z0189 Encounter for other specified special examinations: Secondary | ICD-10-CM | POA: Diagnosis not present

## 2020-01-14 DIAGNOSIS — K640 First degree hemorrhoids: Secondary | ICD-10-CM | POA: Diagnosis not present

## 2020-02-12 DIAGNOSIS — K648 Other hemorrhoids: Secondary | ICD-10-CM | POA: Diagnosis not present

## 2020-02-12 DIAGNOSIS — K625 Hemorrhage of anus and rectum: Secondary | ICD-10-CM | POA: Diagnosis not present

## 2020-03-15 DIAGNOSIS — R0981 Nasal congestion: Secondary | ICD-10-CM | POA: Diagnosis not present

## 2020-03-15 DIAGNOSIS — J069 Acute upper respiratory infection, unspecified: Secondary | ICD-10-CM | POA: Diagnosis not present

## 2020-03-15 DIAGNOSIS — R0982 Postnasal drip: Secondary | ICD-10-CM | POA: Diagnosis not present

## 2020-04-30 DIAGNOSIS — Z0189 Encounter for other specified special examinations: Secondary | ICD-10-CM | POA: Diagnosis not present

## 2020-04-30 DIAGNOSIS — K625 Hemorrhage of anus and rectum: Secondary | ICD-10-CM | POA: Diagnosis not present

## 2020-06-24 ENCOUNTER — Other Ambulatory Visit: Payer: Self-pay

## 2020-06-24 ENCOUNTER — Ambulatory Visit (HOSPITAL_COMMUNITY)
Admission: EM | Admit: 2020-06-24 | Discharge: 2020-06-24 | Disposition: A | Discharge status: Home / Self Care | Payer: BC Managed Care – PPO | Attending: Emergency Medicine | Admitting: Emergency Medicine

## 2020-06-24 ENCOUNTER — Encounter (HOSPITAL_COMMUNITY): Payer: Self-pay

## 2020-06-24 DIAGNOSIS — R000 Tachycardia, unspecified: Secondary | ICD-10-CM | POA: Diagnosis not present

## 2020-06-24 LAB — CBC WITH DIFFERENTIAL/PLATELET
Abs Immature Granulocytes: 0.02 10*3/uL (ref 0.00–0.07)
Basophils Absolute: 0 10*3/uL (ref 0.0–0.1)
Basophils Relative: 0 %
Eosinophils Absolute: 0.2 10*3/uL (ref 0.0–0.5)
Eosinophils Relative: 1 %
HCT: 40.8 % (ref 36.0–46.0)
Hemoglobin: 14.1 g/dL (ref 12.0–15.0)
Immature Granulocytes: 0 %
Lymphocytes Relative: 14 %
Lymphs Abs: 1.5 10*3/uL (ref 0.7–4.0)
MCH: 30.3 pg (ref 26.0–34.0)
MCHC: 34.6 g/dL (ref 30.0–36.0)
MCV: 87.6 fL (ref 80.0–100.0)
Monocytes Absolute: 0.7 10*3/uL (ref 0.1–1.0)
Monocytes Relative: 6 %
Neutro Abs: 8.8 10*3/uL — ABNORMAL HIGH (ref 1.7–7.7)
Neutrophils Relative %: 79 %
Platelets: 267 10*3/uL (ref 150–400)
RBC: 4.66 MIL/uL (ref 3.87–5.11)
RDW: 11.9 % (ref 11.5–15.5)
WBC: 11.2 10*3/uL — ABNORMAL HIGH (ref 4.0–10.5)
nRBC: 0 % (ref 0.0–0.2)

## 2020-06-24 LAB — COMPREHENSIVE METABOLIC PANEL
ALT: 17 U/L (ref 0–44)
AST: 24 U/L (ref 15–41)
Albumin: 4.9 g/dL (ref 3.5–5.0)
Alkaline Phosphatase: 52 U/L (ref 38–126)
Anion gap: 5 (ref 5–15)
BUN: 5 mg/dL — ABNORMAL LOW (ref 6–20)
CO2: 26 mmol/L (ref 22–32)
Calcium: 9.3 mg/dL (ref 8.9–10.3)
Chloride: 103 mmol/L (ref 98–111)
Creatinine, Ser: 0.56 mg/dL (ref 0.44–1.00)
GFR, Estimated: >60 mL/min (ref >60)
Glucose, Bld: 95 mg/dL (ref 70–99)
Potassium: 3.8 mmol/L (ref 3.5–5.1)
Sodium: 134 mmol/L — ABNORMAL LOW (ref 135–145)
Total Bilirubin: 1 mg/dL (ref 0.3–1.2)
Total Protein: 7.8 g/dL (ref 6.5–8.1)

## 2020-06-24 LAB — TSH: TSH: 2.545 u[IU]/mL (ref 0.350–4.500)

## 2020-06-24 NOTE — ED Triage Notes (Signed)
Pt reports having tachycardia x 1 month. States heart rate was 110 today when the nurse for the insurance checked the vital sign today. Pt denies dizziness, chest pain, nausea, vomiting, vision changes.

## 2020-06-24 NOTE — Discharge Instructions (Addendum)
Labs pending- will be notified of any concerning results, if mychart active you will be able to see all results  Follow up with cardiology for further evaluation, information located below   Please go to the nearest emergency department for symptoms of shortness of breath, chest pain, dizziness, weakness

## 2020-06-26 NOTE — ED Provider Notes (Signed)
[facility]    CSN: [id number] Arrival date & time: 06/24/20  1858      History   Chief Complaint Chief Complaint  Patient presents with  . Tachycardia    HPI Vanessa Weaver is a 25 y.o. female.   Patient presents with sinus tachycardia for 1 month. Checked by Gaffer at work today, Resting heart rate 110. Denies dizziness, chest pain, nausea, weakness, fatigue, shortness of breath or visual changes today. Has had shortness of breath while lying in bed, last episode was a few weeks ago. Resolves after a few minutes. Has had an episode of dizziness, was at work when it occurred, last episode three days ago. Denies any medical history. Has family history of pacemaker placement and maternal grandmother had unknown cardiac illness. Does not take any daily medications.   Past Medical History:  Diagnosis Date  . Allergy   . Contraceptive management 10/28/2013    Patient Active Problem List   Diagnosis Date Noted  . Encounter for gynecological examination with Papanicolaou smear of cervix 04/23/2019  . Patient desires pregnancy 04/23/2019  . Encounter for routine gynecological examination with Papanicolaou smear of cervix 10/19/2016  . Encounter for surveillance of contraceptive pills 04/18/2016  . Contraceptive management 10/28/2013    History reviewed. No pertinent surgical history.  OB History    Gravida  0   Para      Term      Preterm      AB      Living        SAB      IAB      Ectopic      Multiple      Live Births               Home Medications    Prior to Admission medications   Medication Sig Start Date End Date Taking? Authorizing Provider  loratadine (CLARITIN) 10 MG tablet Take 10 mg by mouth daily.   Yes [provider]  benzonatate (TESSALON) 100 MG capsule Take 1 capsule (100 mg total) by mouth every 8 (eight) hours. 09/21/19   Wurst, [place], PA-C  hydrOXYzine (ATARAX/VISTARIL) 25 MG tablet Take 25 mg by mouth  as needed.  08/23/17   [provider]  ondansetron (ZOFRAN ODT) 4 MG disintegrating tablet Take 1 tablet (4 mg total) by mouth every 8 (eight) hours as needed for nausea or vomiting. 12/10/19   Avegno, Zachery Dakins, FNP  Prenatal Vit-Fe Fumarate-FA (PRENATAL VITAMIN PO) Take by mouth daily.    [provider]  sertraline (ZOLOFT) 50 MG tablet TK 1 T PO QD UTD 09/21/19   Wurst, [place], PA-C  norethindrone-ethinyl estradiol (GILDESS FE 1/20) 1-20 MG-MCG tablet Take 1 tablet by mouth daily. 10/23/17 02/04/19  Adline Potter, NP    Family History Family History  Problem Relation Age of Onset  . Hypertension Father   . Stroke Father   . Kidney failure Father   . Other Father        liver failure  . Cancer Maternal Grandmother        stomach  . Diabetes Maternal Grandfather   . Heart disease Maternal Grandfather   . Alzheimer's disease Paternal Grandfather     Social History Social History   Tobacco Use  . Smoking status: Former Smoker    Years: 3.00    Types: E-cigarettes  . Smokeless tobacco: Never Used  Vaping Use  . Vaping Use: Former  Substance Use Topics  .  Alcohol use: Yes    Comment: occ  . Drug use: No     Allergies   Patient has no known allergies.   Review of Systems Review of Systems  Constitutional: Negative.   HENT: Negative.   Eyes: Negative.   Respiratory: Negative.   Cardiovascular: Negative.   Gastrointestinal: Negative.   Endocrine: Negative.   Skin: Negative.   Allergic/Immunologic: Negative.   Neurological: Negative.      Physical Exam Triage Vital Signs ED Triage Vitals [06/24/20 1903]  Enc Vitals Group     BP 127/74     Pulse Rate (!) 118     Resp 17     Temp 97.8 F (36.6 C)     Temp Source Oral     SpO2 100 %     Weight      Height      Head Circumference      Peak Flow      Pain Score 0     Pain Loc      Pain Edu?      Excl. in GC?    No data found.  Updated Vital Signs BP 127/74 (BP Location:  Right Arm)   Pulse (!) 118   Temp 97.8 F (36.6 C) (Oral)   Resp 17   LMP 06/07/2020 (Exact Date)   SpO2 100%   Visual Acuity Right Eye Distance:   Left Eye Distance:   Bilateral Distance:    Right Eye Near:   Left Eye Near:    Bilateral Near:     Physical Exam Constitutional:      Appearance: Normal appearance. She is normal weight.  HENT:     Head: Normocephalic.  Eyes:     Extraocular Movements: Extraocular movements intact.  Neck:     Thyroid: No thyroid mass, thyromegaly or thyroid tenderness.  Cardiovascular:     Rate and Rhythm: Regular rhythm. Tachycardia present.     Pulses: Normal pulses.     Heart sounds: Normal heart sounds.  Pulmonary:     Effort: Pulmonary effort is normal.     Breath sounds: Normal breath sounds.  Musculoskeletal:        General: Normal range of motion.     Cervical back: Normal range of motion and neck supple.  Skin:    General: Skin is warm and dry.  Neurological:     Mental Status: She is alert and oriented to person, place, and time. Mental status is at baseline.  Psychiatric:        Mood and Affect: Mood normal.        Behavior: Behavior normal.        Thought Content: Thought content normal.        Judgment: Judgment normal.      UC Treatments / Results  Labs (all labs ordered are listed, but only abnormal results are displayed) Labs Reviewed  COMPREHENSIVE METABOLIC PANEL - Abnormal; Notable for the following components:      Result Value   Sodium 134 (*)    BUN 5 (*)    All other components within normal limits  CBC WITH DIFFERENTIAL/PLATELET - Abnormal; Notable for the following components:   WBC 11.2 (*)    Neutro Abs 8.8 (*)    All other components within normal limits  TSH    EKG   Radiology No results found.  Procedures Procedures (including critical care time)  Medications Ordered in UC Medications - No data to display  Initial Impression /  Assessment and Plan / UC Course  I have reviewed the  triage vital signs and the nursing notes.  Pertinent labs & imaging results that were available during my care of the patient were reviewed by me and considered in my medical decision making (see chart for details).  Sinus tachycardia  1. CMP, CBC with diff, TSH- pending 2. EKG- sinus tachycardia  3. Heartcare referral with instructions for appointment asap, in agreement with plan, verbalized understanding 4. Instructed to go to ED for shortness of breath, dizziness, chest pain, weakness while having tachycardia, verbalized understanding Final Clinical Impressions(s) / UC Diagnoses   Final diagnoses:  Sinus tachycardia     Discharge Instructions     Labs pending- will be notified of any concerning results, if mychart active you will be able to see all results  Follow up with cardiology for further evaluation, information located below   Please go to the nearest emergency department for symptoms of shortness of breath, chest pain, dizziness, weakness    ED Prescriptions    None     PDMP not reviewed this encounter.   Valinda Hoar, NP 06/26/20 1544

## 2020-06-29 ENCOUNTER — Other Ambulatory Visit: Payer: Self-pay

## 2020-06-29 ENCOUNTER — Encounter: Payer: Self-pay | Admitting: Emergency Medicine

## 2020-06-29 ENCOUNTER — Ambulatory Visit
Admission: EM | Admit: 2020-06-29 | Discharge: 2020-06-29 | Disposition: A | Discharge status: Home / Self Care | Payer: BC Managed Care – PPO

## 2020-06-29 DIAGNOSIS — R000 Tachycardia, unspecified: Secondary | ICD-10-CM | POA: Diagnosis not present

## 2020-06-29 NOTE — ED Provider Notes (Signed)
[facility]    CSN: [id number] Arrival date & time: 06/29/20  1250      History   Chief Complaint No chief complaint on file.   HPI Vanessa Weaver is a 25 y.o. female.   Reports that she has been having a high heart rate for the last week or so. Reports pulse up to 120. Was seen at [facility] and worked up with normal EKG. Reports that she stopped smoking at the beginning of the month. States that she has a visit with PCP tomorrow. Reports that she left work today due to her heart rate being high and fear of passing out. Denies dizziness, fatigue, chest pain, back pain, cough, nausea, vomiting, diarrhea, rash, fever, other symptoms.  ROS per HPI  The history is provided by the patient.    Past Medical History:  Diagnosis Date  . Allergy   . Contraceptive management 10/28/2013    Patient Active Problem List   Diagnosis Date Noted  . Encounter for gynecological examination with Papanicolaou smear of cervix 04/23/2019  . Patient desires pregnancy 04/23/2019  . Encounter for routine gynecological examination with Papanicolaou smear of cervix 10/19/2016  . Encounter for surveillance of contraceptive pills 04/18/2016  . Contraceptive management 10/28/2013    History reviewed. No pertinent surgical history.  OB History    Gravida  0   Para      Term      Preterm      AB      Living        SAB      IAB      Ectopic      Multiple      Live Births               Home Medications    Prior to Admission medications   Medication Sig Start Date End Date Taking? Authorizing Provider  benzonatate (TESSALON) 100 MG capsule Take 1 capsule (100 mg total) by mouth every 8 (eight) hours. 09/21/19   Wurst, [place], PA-C  hydrOXYzine (ATARAX/VISTARIL) 25 MG tablet Take 25 mg by mouth as needed.  08/23/17   [provider]  loratadine (CLARITIN) 10 MG tablet Take 10 mg by mouth daily.    [provider]  ondansetron (ZOFRAN ODT) 4 MG  disintegrating tablet Take 1 tablet (4 mg total) by mouth every 8 (eight) hours as needed for nausea or vomiting. 12/10/19   Avegno, Zachery Dakins, FNP  Prenatal Vit-Fe Fumarate-FA (PRENATAL VITAMIN PO) Take by mouth daily.    [provider]  sertraline (ZOLOFT) 50 MG tablet TK 1 T PO QD UTD 09/21/19   Wurst, [place], PA-C  norethindrone-ethinyl estradiol (GILDESS FE 1/20) 1-20 MG-MCG tablet Take 1 tablet by mouth daily. 10/23/17 02/04/19  Adline Potter, NP    Family History Family History  Problem Relation Age of Onset  . Hypertension Father   . Stroke Father   . Kidney failure Father   . Other Father        liver failure  . Cancer Maternal Grandmother        stomach  . Diabetes Maternal Grandfather   . Heart disease Maternal Grandfather   . Alzheimer's disease Paternal Grandfather     Social History Social History   Tobacco Use  . Smoking status: Former Smoker    Years: 3.00    Types: E-cigarettes  . Smokeless tobacco: Never Used  Vaping Use  . Vaping Use: Former  Substance Use Topics  .  Alcohol use: Yes    Comment: occ  . Drug use: No     Allergies   Patient has no known allergies.   Review of Systems Review of Systems   Physical Exam Triage Vital Signs ED Triage Vitals  Enc Vitals Group     BP 06/29/20 1417 119/81     Pulse Rate 06/29/20 1417 94     Resp 06/29/20 1417 18     Temp 06/29/20 1417 98.4 F (36.9 C)     Temp Source 06/29/20 1417 Oral     SpO2 06/29/20 1417 98 %     Weight --      Height --      Head Circumference --      Peak Flow --      Pain Score 06/29/20 1416 0     Pain Loc --      Pain Edu? --      Excl. in GC? --    No data found.  Updated Vital Signs BP 119/81 (BP Location: Right Arm)   Pulse 94   Temp 98.4 F (36.9 C) (Oral)   Resp 18   LMP 06/07/2020 (Exact Date)   SpO2 98%      Physical Exam Vitals and nursing note reviewed.  Constitutional:      General: She is not in acute distress.    Appearance:  Normal appearance. She is well-developed. She is not ill-appearing.  HENT:     Head: Normocephalic and atraumatic.     Mouth/Throat:     Mouth: Mucous membranes are moist.     Pharynx: Oropharynx is clear.  Eyes:     Extraocular Movements: Extraocular movements intact.     Conjunctiva/sclera: Conjunctivae normal.     Pupils: Pupils are equal, round, and reactive to light.  Cardiovascular:     Rate and Rhythm: Normal rate and regular rhythm.     Heart sounds: Normal heart sounds. No murmur heard.   Pulmonary:     Effort: Pulmonary effort is normal. No respiratory distress.     Breath sounds: Normal breath sounds. No stridor. No wheezing, rhonchi or rales.  Chest:     Chest wall: No tenderness.  Abdominal:     Palpations: Abdomen is soft.     Tenderness: There is no abdominal tenderness.  Musculoskeletal:        General: Normal range of motion.     Cervical back: Normal range of motion and neck supple.  Skin:    General: Skin is warm and dry.     Capillary Refill: Capillary refill takes less than 2 seconds.  Neurological:     General: No focal deficit present.     Mental Status: She is alert and oriented to person, place, and time.  Psychiatric:        Mood and Affect: Mood normal.        Behavior: Behavior normal.        Thought Content: Thought content normal.      UC Treatments / Results  Labs (all labs ordered are listed, but only abnormal results are displayed) Labs Reviewed - No data to display  EKG   Radiology No results found.  Procedures Procedures (including critical care time)  Medications Ordered in UC Medications - No data to display  Initial Impression / Assessment and Plan / UC Course  I have reviewed the triage vital signs and the nursing notes.  Pertinent labs & imaging results that were available during my care of  the patient were reviewed by me and considered in my medical decision making (see chart for details).     Tachycardia  Discussed that HR could be increased due to recently stopping smoking Normal exam today Discussed that she may want to ask PCP for heart monitor tomorrow States that she is trying to get in with cardiology Work note provided for today Discussed when to follow up in the ER  Final Clinical Impressions(s) / UC Diagnoses   Final diagnoses:  Tachycardia     Discharge Instructions     No concern for arrhythmia today  No murmurs noted  Follow up with Dr Margo Aye and then cardiology as scheduled    ED Prescriptions    None     PDMP not reviewed this encounter.   Moshe Cipro, NP 06/29/20 1603

## 2020-06-29 NOTE — Discharge Instructions (Signed)
No concern for arrhythmia today  No murmurs noted  Follow up with Dr Margo Aye and then cardiology as scheduled

## 2020-06-29 NOTE — ED Triage Notes (Signed)
Needs note for work for today.  States her heart rate was up earlier today .  Denies any cp and does not feel like her hr is up anymore.

## 2020-06-30 DIAGNOSIS — F331 Major depressive disorder, recurrent, moderate: Secondary | ICD-10-CM | POA: Diagnosis not present

## 2020-06-30 DIAGNOSIS — Z0189 Encounter for other specified special examinations: Secondary | ICD-10-CM | POA: Diagnosis not present

## 2020-06-30 DIAGNOSIS — I479 Paroxysmal tachycardia, unspecified: Secondary | ICD-10-CM | POA: Diagnosis not present

## 2020-08-03 ENCOUNTER — Ambulatory Visit (INDEPENDENT_AMBULATORY_CARE_PROVIDER_SITE_OTHER): Payer: BC Managed Care – PPO

## 2020-08-03 ENCOUNTER — Encounter: Payer: Self-pay | Admitting: Cardiovascular Disease

## 2020-08-03 ENCOUNTER — Ambulatory Visit (INDEPENDENT_AMBULATORY_CARE_PROVIDER_SITE_OTHER): Payer: BC Managed Care – PPO | Admitting: Cardiovascular Disease

## 2020-08-03 ENCOUNTER — Other Ambulatory Visit: Payer: Self-pay

## 2020-08-03 DIAGNOSIS — R000 Tachycardia, unspecified: Secondary | ICD-10-CM | POA: Insufficient documentation

## 2020-08-03 NOTE — Assessment & Plan Note (Signed)
Ms. Ferrufino was referred by the ER for evaluation of sinus tachycardia.  This was recently come to medical attention.  She was seen in the ER on/19/22.  She is completely asymptomatic.  She has no cardiac risk risk factors.  She denies chest pain or shortness of breath.  There is no family history of heart disease.  She has discontinued nicotine and caffeine.  Her TSH was normal.  I am going to get a 2D echocardiogram and a 2-week Zio patch to further evaluate.

## 2020-08-03 NOTE — Patient Instructions (Addendum)
Medication Instructions:  Your physician recommends that you continue on your current medications as directed. Please refer to the Current Medication list given to you today.  *If you need a refill on your cardiac medications before your next appointment, please call your pharmacy*   Testing/Procedures: Your physician has requested that you have an echocardiogram. Echocardiography is a painless test that uses sound waves to create images of your heart. It provides your doctor with information about the size and shape of your heart and how well your heart's chambers and valves are working. This procedure takes approximately one hour. There are no restrictions for this procedure. This procedure is done at [address]rd [address]  ZIO XT- Long Term Monitor Instructions   Your physician has requested you wear a ZIO patch monitor for 14 days.  This is a single patch monitor.   [employer] supplies one patch monitor per enrollment. Additional stickers are not available. Please do not apply patch if you will be having a Nuclear Stress Test, Echocardiogram, Cardiac CT, MRI, or Chest Xray during the period you would be wearing the monitor. The patch cannot be worn during these tests. You cannot remove and re-apply the ZIO XT patch monitor.  Your ZIO patch monitor will be sent Fed Ex from [employer] directly to your home address. It may take 3-5 days to receive your monitor after you have been enrolled.  Once you have received your monitor, please review the enclosed instructions. Your monitor has already been registered assigning a specific monitor serial # to you.  Billing and Patient Assistance Program Information   We have supplied [employer] with any of your insurance information on file for billing purposes. [employer] offers a sliding scale Patient Assistance Program for patients that do not have insurance, or whose insurance does not completely cover the cost of the ZIO monitor.   You must  apply for the Patient Assistance Program to qualify for this discounted rate.     To apply, please call [employer] at [phone], select option 4, then select option 2, and ask to apply for Patient Assistance Program.  Meredeth Ide will ask your household income, and how many people are in your household.  They will quote your out-of-pocket cost based on that information.  [employer] will also be able to set up a 60-month, interest-free payment plan if needed.  Applying the monitor   Shave hair from upper left chest.  Hold abrader disc by orange tab. Rub abrader in 40 strokes over the upper left chest as indicated in your monitor instructions.  Clean area with 4 enclosed alcohol pads. Let dry.  Apply patch as indicated in monitor instructions. Patch will be placed under collarbone on left side of chest with arrow pointing upward.  Rub patch adhesive wings for 2 minutes. Remove white label marked "1". Remove the white label marked "2". Rub patch adhesive wings for 2 additional minutes.  While looking in a mirror, press and release button in center of patch. A small green light will flash 3-4 times. This will be your only indicator that the monitor has been turned on.  ?  Do not shower for the first 24 hours. You may shower after the first 24 hours.   Press the button if you feel a symptom. You will hear a small click. Record Date, Time and Symptom in the Patient Logbook.  When you are ready to remove the patch, follow instructions on the last 2 pages of the Patient Logbook. Stick  patch monitor onto the last page of Patient Logbook.  Place Patient Logbook in the blue and white box.  Use locking tab on box and tape box closed securely.  The blue and white box has prepaid postage on it. Please place it in the mailbox as soon as possible. Your physician should have your test results approximately 7 days after the monitor has been mailed back to [facility]  Call [facility] Customer Care at [phone] if  you have questions regarding your ZIO XT patch monitor. Call them immediately if you see an orange light blinking on your monitor.  If your monitor falls off in less than 4 days, contact our Monitor department at [phone]. ?If your monitor becomes loose or falls off after 4 days call [employer] at [phone] for suggestions on securing your monitor.?   Follow-Up: At [facility], you and your health needs are our priority.  As part of our continuing mission to provide you with exceptional heart care, we have created designated Provider Care Teams.  These Care Teams include your primary Cardiologist (physician) and Advanced Practice Providers (APPs -  Physician Assistants and Nurse Practitioners) who all work together to provide you with the care you need, when you need it.  We recommend signing up for the patient portal called "MyChart".  Sign up information is provided on this After Visit Summary.  MyChart is used to connect with patients for Virtual Visits (Telemedicine).  Patients are able to view lab/test results, encounter notes, upcoming appointments, etc.  Non-urgent messages can be sent to your provider as well.   To learn more about what you can do with MyChart, go to [email or web address].    Your next appointment:   6-8 week(s)  The format for your next appointment:   In Person  Provider:   Nanetta Batty, MD

## 2020-08-03 NOTE — Progress Notes (Signed)
08/03/2020 Vanessa Weaver   1995-12-27  [id number]  Primary Physician Benita Stabile, MD Primary Cardiologist: Runell Gess MD Nicholes Calamity, [place]  HPI:  Vanessa Weaver is a 25 y.o. thin appearing married Caucasian female with no children who is an Biomedical scientist at [employer].  She was referred by the emergency room for evaluation of resting tachycardia.  This was found at work by an Sports administrator.  Her evaluation in the ER was unrevealing.  She has no cardiac risk factors.  She is unaware that she is tachycardic.  She has discontinued nicotine and caffeine.  She drinks socially.  She does not use recreational drugs.  Her TSH was normal.   Current Meds  Medication Sig  . [DISCONTINUED] benzonatate (TESSALON) 100 MG capsule Take 1 capsule (100 mg total) by mouth every 8 (eight) hours.  . [DISCONTINUED] hydrOXYzine (ATARAX/VISTARIL) 25 MG tablet Take 25 mg by mouth as needed.   . [DISCONTINUED] loratadine (CLARITIN) 10 MG tablet Take 10 mg by mouth daily.  . [DISCONTINUED] ondansetron (ZOFRAN ODT) 4 MG disintegrating tablet Take 1 tablet (4 mg total) by mouth every 8 (eight) hours as needed for nausea or vomiting.  . [DISCONTINUED] Prenatal Vit-Fe Fumarate-FA (PRENATAL VITAMIN PO) Take by mouth daily.  . [DISCONTINUED] sertraline (ZOLOFT) 50 MG tablet TK 1 T PO QD UTD     No Known Allergies  Social History   Socioeconomic History  . Marital status: Married    Spouse name: Not on file  . Number of children: Not on file  . Years of education: Not on file  . Highest education level: Not on file  Occupational History  . Not on file  Tobacco Use  . Smoking status: Former Smoker    Years: 3.00    Types: E-cigarettes  . Smokeless tobacco: Never Used  Vaping Use  . Vaping Use: Former  Substance and Sexual Activity  . Alcohol use: Yes    Comment: occ  . Drug use: No  . Sexual activity: Yes    Birth control/protection: None  Other Topics Concern  . Not on  file  Social History Narrative  . Not on file   Social Determinants of Health   Financial Resource Strain: Not on file  Food Insecurity: Not on file  Transportation Needs: Not on file  Physical Activity: Not on file  Stress: Not on file  Social Connections: Not on file  Intimate Partner Violence: Not on file     Review of Systems: General: negative for chills, fever, night sweats or weight changes.  Cardiovascular: negative for chest pain, dyspnea on exertion, edema, orthopnea, palpitations, paroxysmal nocturnal dyspnea or shortness of breath Dermatological: negative for rash Respiratory: negative for cough or wheezing Urologic: negative for hematuria Abdominal: negative for nausea, vomiting, diarrhea, bright red blood per rectum, melena, or hematemesis Neurologic: negative for visual changes, syncope, or dizziness All other systems reviewed and are otherwise negative except as noted above.    Blood pressure 108/62, pulse (!) 107, height 5\' 3"  (1.6 m), weight 130 lb (59 kg).  General appearance: alert and no distress Neck: no adenopathy, no carotid bruit, no JVD, supple, symmetrical, trachea midline and thyroid not enlarged, symmetric, no tenderness/mass/nodules Lungs: clear to auscultation bilaterally Heart: regular rate and rhythm, S1, S2 normal, no murmur, click, rub or gallop Extremities: extremities normal, atraumatic, no cyanosis or edema Pulses: 2+ and symmetric Skin: Skin color, texture, turgor normal. No rashes or lesions Neurologic: Alert and oriented  X 3, normal strength and tone. Normal symmetric reflexes. Normal coordination and gait  EKG sinus tachycardia 107 with incomplete right bundle branch block.  I personally reviewed this EKG.  ASSESSMENT AND PLAN:   Sinus tachycardia Ms. Zweig was referred by the ER for evaluation of sinus tachycardia.  This was recently come to medical attention.  She was seen in the ER on/19/22.  She is completely asymptomatic.  She  has no cardiac risk risk factors.  She denies chest pain or shortness of breath.  There is no family history of heart disease.  She has discontinued nicotine and caffeine.  Her TSH was normal.  I am going to get a 2D echocardiogram and a 2-week Zio patch to further evaluate.      Runell Gess MD FACP,FACC,FAHA, [facility] 08/03/2020 3:59 PM

## 2020-08-03 NOTE — Progress Notes (Unsigned)
Patient enrolled for [employer] to mail a 14 day ZIO XT monitor to her home. 

## 2020-08-06 DIAGNOSIS — R000 Tachycardia, unspecified: Secondary | ICD-10-CM

## 2020-08-20 ENCOUNTER — Encounter: Payer: Self-pay | Admitting: Emergency Medicine

## 2020-08-20 ENCOUNTER — Other Ambulatory Visit: Payer: Self-pay

## 2020-08-20 ENCOUNTER — Ambulatory Visit
Admission: EM | Admit: 2020-08-20 | Discharge: 2020-08-20 | Disposition: A | Discharge status: Home / Self Care | Payer: BC Managed Care – PPO | Attending: Emergency Medicine | Admitting: Emergency Medicine

## 2020-08-20 DIAGNOSIS — R0981 Nasal congestion: Secondary | ICD-10-CM | POA: Diagnosis not present

## 2020-08-20 DIAGNOSIS — R059 Cough, unspecified: Secondary | ICD-10-CM

## 2020-08-20 HISTORY — DX: Tachycardia, unspecified: R00.0

## 2020-08-20 MED ORDER — BENZONATATE 100 MG PO CAPS: 100.0000 mg | ORAL_CAPSULE | Freq: Three times a day (TID) | ORAL | 0 refills | Status: DC

## 2020-08-20 MED ORDER — AMOXICILLIN-POT CLAVULANATE 875-125 MG PO TABS: 1.0000 | ORAL_TABLET | Freq: Two times a day (BID) | ORAL | 0 refills | Status: AC

## 2020-08-20 NOTE — ED Triage Notes (Signed)
Sore throat, runny nose and cough since Wednesday morning.  Pt reports she thinks she has a sinus infection.

## 2020-08-20 NOTE — Discharge Instructions (Addendum)
Get plenty of rest and push fluids Augmentin prescribed for possible sinus infection.  Do not fill medication for another day or two, as this may be a viral infection and improve within that time frame Tessalon Perles prescribed for cough Use OTC zyrtec for nasal congestion, runny nose, and/or sore throat Use OTC flonase for nasal congestion and runny nose Use medications daily for symptom relief Use OTC medications like ibuprofen or tylenol as needed fever or pain Call or go to the ED if you have any new or worsening symptoms such as fever, worsening cough, shortness of breath, chest tightness, chest pain, turning blue, changes in mental status, etc..Marland Kitchen

## 2020-08-20 NOTE — ED Provider Notes (Signed)
[facility]   [id number] 08/20/20 Arrival Time: 1103   CC: sore throat  SUBJECTIVE: History from: patient.  Graciela Plato is a 25 y.o. female who presents with sore throat, runny nose, and cough x 2 days.  Denies sick exposure to COVID, flu or strep.  Has NOT tried OTC medications without relief.  Denies aggravating factors.  Reports previous symptoms in the past.   Denies fever, chills, fatigue, sinus pain, SOB, wheezing, chest pain, nausea, changes in bowel or bladder habits.     ROS: As per HPI.  All other pertinent ROS negative.     Past Medical History:  Diagnosis Date   Allergy    Contraceptive management 10/28/2013   Tachycardia    History reviewed. No pertinent surgical history. No Known Allergies No current facility-administered medications on file prior to encounter.   Current Outpatient Medications on File Prior to Encounter  Medication Sig Dispense Refill   [DISCONTINUED] norethindrone-ethinyl estradiol (GILDESS FE 1/20) 1-20 MG-MCG tablet Take 1 tablet by mouth daily. 84 tablet 4   Social History   Socioeconomic History   Marital status: Married    Spouse name: Not on file   Number of children: Not on file   Years of education: Not on file   Highest education level: Not on file  Occupational History   Not on file  Tobacco Use   Smoking status: Former    Pack years: 0.00    Types: E-cigarettes   Smokeless tobacco: Never  Vaping Use   Vaping Use: Former  Substance and Sexual Activity   Alcohol use: Yes    Comment: occ   Drug use: No   Sexual activity: Yes    Birth control/protection: None  Other Topics Concern   Not on file  Social History Narrative   Not on file   Social Determinants of Health   Financial Resource Strain: Not on file  Food Insecurity: Not on file  Transportation Needs: Not on file  Physical Activity: Not on file  Stress: Not on file  Social Connections: Not on file  Intimate Partner Violence: Not on file    Family History  Problem Relation Age of Onset   Hypertension Father    Stroke Father    Kidney failure Father    Other Father        liver failure   Cancer Maternal Grandmother        stomach   Diabetes Maternal Grandfather    Heart disease Maternal Grandfather    Alzheimer's disease Paternal Grandfather     OBJECTIVE:  Vitals:   08/20/20 1228  BP: 116/81  Pulse: 98  Resp: 18  Temp: 98.4 F (36.9 C)  TempSrc: Tympanic  SpO2: 99%     General appearance: alert; appears mildly fatigued, but nontoxic; speaking in full sentences and tolerating own secretions HEENT: NCAT; Ears: EACs clear, TMs pearly gray; Eyes: PERRL.  EOM grossly intact. Nose: nares patent without rhinorrhea, Throat: oropharynx clear, tonsils non erythematous or enlarged, uvula midline  Neck: supple without LAD Lungs: unlabored respirations, symmetrical air entry; cough: absent; no respiratory distress; CTAB Heart: regular rate and rhythm.   Skin: warm and dry Psychological: alert and cooperative; normal mood and affect  ASSESSMENT & PLAN:  1. Sinus congestion   2. Cough     Meds ordered this encounter  Medications   amoxicillin-clavulanate (AUGMENTIN) 875-125 MG tablet    Sig: Take 1 tablet by mouth every 12 (twelve) hours for 10 days.  Dispense:  20 tablet    Refill:  0    Order Specific Question:   Supervising Provider    Answer:   Eustace Moore [[id number]]   benzonatate (TESSALON) 100 MG capsule    Sig: Take 1 capsule (100 mg total) by mouth every 8 (eight) hours.    Dispense:  21 capsule    Refill:  0    Order Specific Question:   Supervising Provider    Answer:   Eustace Moore [[id number]]    Get plenty of rest and push fluids Augmentin prescribed for possible sinus infection.  Do not fill medication for another day or two, as this may be a viral infection and improve within that time frame Tessalon Perles prescribed for cough Use OTC zyrtec for nasal congestion, runny nose,  and/or sore throat Use OTC flonase for nasal congestion and runny nose Use medications daily for symptom relief Use OTC medications like ibuprofen or tylenol as needed fever or pain Call or go to the ED if you have any new or worsening symptoms such as fever, worsening cough, shortness of breath, chest tightness, chest pain, turning blue, changes in mental status, etc...   Reviewed expectations re: course of current medical issues. Questions answered. Outlined signs and symptoms indicating need for more acute intervention. Patient verbalized understanding. After Visit Summary given.          Rennis Harding, PA-C 08/20/20 1332

## 2020-08-23 DIAGNOSIS — R000 Tachycardia, unspecified: Secondary | ICD-10-CM | POA: Diagnosis not present

## 2020-08-25 DIAGNOSIS — Z0001 Encounter for general adult medical examination with abnormal findings: Secondary | ICD-10-CM | POA: Diagnosis not present

## 2020-09-01 DIAGNOSIS — Z0001 Encounter for general adult medical examination with abnormal findings: Secondary | ICD-10-CM | POA: Diagnosis not present

## 2020-09-02 ENCOUNTER — Other Ambulatory Visit: Payer: Self-pay

## 2020-09-02 ENCOUNTER — Ambulatory Visit (HOSPITAL_COMMUNITY): Payer: BC Managed Care – PPO | Attending: Cardiology

## 2020-09-02 DIAGNOSIS — R000 Tachycardia, unspecified: Secondary | ICD-10-CM | POA: Diagnosis not present

## 2020-09-02 LAB — ECHOCARDIOGRAM COMPLETE
Area-P 1/2: 3.48 cm2
S' Lateral: 3.1 cm

## 2020-09-14 ENCOUNTER — Encounter: Payer: Self-pay | Admitting: Cardiovascular Disease

## 2020-09-14 ENCOUNTER — Ambulatory Visit (INDEPENDENT_AMBULATORY_CARE_PROVIDER_SITE_OTHER): Payer: BC Managed Care – PPO | Admitting: Cardiovascular Disease

## 2020-09-14 ENCOUNTER — Other Ambulatory Visit: Payer: Self-pay

## 2020-09-14 VITALS — BP 98/68 | HR 108 | Ht 63.0 in | Wt 134.0 lb

## 2020-09-14 DIAGNOSIS — R000 Tachycardia, unspecified: Secondary | ICD-10-CM | POA: Diagnosis not present

## 2020-09-14 NOTE — Progress Notes (Signed)
[name] returns for follow-up of her noninvasive test.  Her 2D echo was normal.  Her event monitor showed sinus rhythm with an average heart rate of 94 with occasional PACs and PVCs.  Her TSH was normal as well.  Since stopping smoking and caffeine intake and her symptoms have improved/resolved.  No further work-up is required at this time and I will see her back as needed.  Twelve-lead EKG today reveals sinus tachycardia at 108 with incomplete right bundle branch block.  Vanessa Weaver, M.D., FACP, [facility], [facility] [address] [place], [place]  [address]  [phone] 09/14/2020 2:01 PM

## 2020-09-14 NOTE — Patient Instructions (Signed)
Medication Instructions:  No Changes In Medications at this time.  *If you need a refill on your cardiac medications before your next appointment, please call your pharmacy*  Follow-Up: At [facility], you and your health needs are our priority.  As part of our continuing mission to provide you with exceptional heart care, we have created designated Provider Care Teams.  These Care Teams include your primary Cardiologist (physician) and Advanced Practice Providers (APPs -  Physician Assistants and Nurse Practitioners) who all work together to provide you with the care you need, when you need it.  Your next appointment:   AS NEEDED   The format for your next appointment:   In Person  Provider:   Jonathan Berry, MD  

## 2020-12-20 ENCOUNTER — Ambulatory Visit
Admission: EM | Admit: 2020-12-20 | Discharge: 2020-12-20 | Disposition: A | Discharge status: Home / Self Care | Payer: BC Managed Care – PPO | Attending: Urgent Care | Admitting: Urgent Care

## 2020-12-20 ENCOUNTER — Other Ambulatory Visit: Payer: Self-pay

## 2020-12-20 DIAGNOSIS — R300 Dysuria: Secondary | ICD-10-CM | POA: Diagnosis not present

## 2020-12-20 DIAGNOSIS — N3001 Acute cystitis with hematuria: Secondary | ICD-10-CM | POA: Insufficient documentation

## 2020-12-20 DIAGNOSIS — Z87448 Personal history of other diseases of urinary system: Secondary | ICD-10-CM | POA: Insufficient documentation

## 2020-12-20 LAB — POCT URINALYSIS DIP (MANUAL ENTRY)
Glucose, UA: 100 mg/dL — AB
Leukocytes, UA: NEGATIVE
Nitrite, UA: POSITIVE — AB
Spec Grav, UA: 1.015 (ref 1.010–1.025)
Urobilinogen, UA: 2 E.U./dL — AB
pH, UA: 5.5 (ref 5.0–8.0)

## 2020-12-20 LAB — POCT URINE PREGNANCY: Preg Test, Ur: NEGATIVE

## 2020-12-20 MED ORDER — NITROFURANTOIN MONOHYD MACRO 100 MG PO CAPS: 100.0000 mg | ORAL_CAPSULE | Freq: Two times a day (BID) | ORAL | 0 refills | Status: DC

## 2020-12-20 NOTE — ED Triage Notes (Signed)
Pt presents with dysuria, lower back pain x 3 days.  Has been taking AZO. Has had UTIs in past with similar presentation.

## 2020-12-20 NOTE — ED Provider Notes (Signed)
[facility]   MRN: [id number] DOB: [birth year]  Subjective:   Vanessa Weaver is a 25 y.o. female presenting for 3-day history of acute onset recurrent dysuria, urinary frequency, mild intermittent low back pain.  Has started to use Azo with some relief.  Has a history of pyelonephritis, UTIs and feels very similar to the UTI.  No concern for sexually transmitted infection.  No current facility-administered medications for this encounter.  Current Outpatient Medications:    loratadine (CLARITIN) 10 MG tablet, Take 10 mg by mouth daily., Disp: , Rfl:    No Known Allergies  Past Medical History:  Diagnosis Date   Allergy    Contraceptive management 10/28/2013   Tachycardia      History reviewed. No pertinent surgical history.  Family History  Problem Relation Age of Onset   Hypertension Father    Stroke Father    Kidney failure Father    Other Father        liver failure   Cancer Maternal Grandmother        stomach   Diabetes Maternal Grandfather    Heart disease Maternal Grandfather    Alzheimer's disease Paternal Grandfather     Social History   Tobacco Use   Smoking status: Former    Types: E-cigarettes   Smokeless tobacco: Never  Building services engineer Use: Former  Substance Use Topics   Alcohol use: Yes    Comment: rarely   Drug use: Yes    Frequency: 7.0 times per week    Types: Marijuana    ROS   Objective:   Vitals: BP 113/78 (BP Location: Right Arm)   Pulse (!) 113   Temp 98.6 F (37 C) (Oral)   Resp 16   LMP 12/08/2020 (Exact Date)   SpO2 98%   Physical Exam Constitutional:      General: She is not in acute distress.    Appearance: Normal appearance. She is well-developed. She is not ill-appearing, toxic-appearing or diaphoretic.  HENT:     Head: Normocephalic and atraumatic.     Nose: Nose normal.     Mouth/Throat:     Mouth: Mucous membranes are moist.     Pharynx: Oropharynx is clear.  Eyes:     General: No scleral  icterus.       Right eye: No discharge.        Left eye: No discharge.     Extraocular Movements: Extraocular movements intact.     Conjunctiva/sclera: Conjunctivae normal.     Pupils: Pupils are equal, round, and reactive to light.  Cardiovascular:     Rate and Rhythm: Normal rate.  Pulmonary:     Effort: Pulmonary effort is normal.  Abdominal:     General: Bowel sounds are normal. There is no distension.     Palpations: Abdomen is soft. There is no mass.     Tenderness: There is no abdominal tenderness. There is no right CVA tenderness, left CVA tenderness, guarding or rebound.  Skin:    General: Skin is warm and dry.  Neurological:     General: No focal deficit present.     Mental Status: She is alert and oriented to person, place, and time.  Psychiatric:        Mood and Affect: Mood normal.        Behavior: Behavior normal.        Thought Content: Thought content normal.        Judgment: Judgment normal.  Results for orders placed or performed during the hospital encounter of 12/20/20 (from the past 24 hour(s))  POCT urinalysis dipstick     Status: Abnormal   Collection Time: 12/20/20 10:07 AM  Result Value Ref Range   Color, UA orange (A) yellow   Clarity, UA clear clear   Glucose, UA =100 (A) negative mg/dL   Bilirubin, UA small (A) negative   Ketones, POC UA trace (5) (A) negative mg/dL   Spec Grav, UA 8.206 0.156 - 1.025   Blood, UA small (A) negative   pH, UA 5.5 5.0 - 8.0   Protein Ur, POC trace (A) negative mg/dL   Urobilinogen, UA 2.0 (A) 0.2 or 1.0 E.U./dL   Nitrite, UA Positive (A) Negative   Leukocytes, UA Negative Negative  POCT urine pregnancy     Status: None   Collection Time: 12/20/20 10:09 AM  Result Value Ref Range   Preg Test, Ur Negative Negative    Assessment and Plan :   PDMP not reviewed this encounter.  1. Acute cystitis with hematuria   2. Dysuria    Start Macrobid to cover for acute cystitis, urine culture pending.  Recommended  aggressive hydration, limiting urinary irritants.  No CVA tenderness and therefore we will hold off on treatment for pyelonephritis.  She has a history of sinus tachycardia and therefore I do not think this is a systemic sign of what is going on with her today.  Counseled patient on potential for adverse effects with medications prescribed/recommended today, ER and return-to-clinic precautions discussed, patient verbalized understanding.    Wallis Bamberg, PA-C 12/20/20 1019

## 2020-12-22 LAB — URINE CULTURE: Culture: 20000 — AB

## 2021-04-11 ENCOUNTER — Ambulatory Visit
Admission: EM | Admit: 2021-04-11 | Discharge: 2021-04-11 | Disposition: A | Discharge status: Home / Self Care | Payer: BC Managed Care – PPO | Attending: Urgent Care | Admitting: Urgent Care

## 2021-04-11 ENCOUNTER — Encounter: Payer: Self-pay | Admitting: Emergency Medicine

## 2021-04-11 ENCOUNTER — Other Ambulatory Visit: Payer: Self-pay

## 2021-04-11 DIAGNOSIS — R0982 Postnasal drip: Secondary | ICD-10-CM | POA: Insufficient documentation

## 2021-04-11 DIAGNOSIS — R052 Subacute cough: Secondary | ICD-10-CM

## 2021-04-11 DIAGNOSIS — J029 Acute pharyngitis, unspecified: Secondary | ICD-10-CM | POA: Diagnosis not present

## 2021-04-11 LAB — POCT RAPID STREP A (OFFICE): Rapid Strep A Screen: NEGATIVE

## 2021-04-11 MED ORDER — AMOXICILLIN 500 MG PO CAPS: 500.0000 mg | ORAL_CAPSULE | Freq: Two times a day (BID) | ORAL | 0 refills | Status: DC

## 2021-04-11 MED ORDER — PSEUDOEPHEDRINE HCL 30 MG PO TABS: 30.0000 mg | ORAL_TABLET | Freq: Three times a day (TID) | ORAL | 0 refills | Status: DC | PRN

## 2021-04-11 MED ORDER — BENZONATATE 100 MG PO CAPS: 100.0000 mg | ORAL_CAPSULE | Freq: Three times a day (TID) | ORAL | 0 refills | Status: DC | PRN

## 2021-04-11 MED ORDER — PROMETHAZINE-DM 6.25-15 MG/5ML PO SYRP: 5.0000 mL | ORAL_SOLUTION | Freq: Every evening | ORAL | 0 refills | Status: DC | PRN

## 2021-04-11 MED ORDER — CETIRIZINE HCL 10 MG PO TABS: 10.0000 mg | ORAL_TABLET | Freq: Every day | ORAL | 0 refills | Status: DC

## 2021-04-11 NOTE — ED Provider Notes (Signed)
[facility]   MRN: [id number] DOB: Apr 26, 1995  Subjective:   Vanessa Weaver is a 26 y.o. female presenting for 1 day history of acute onset throat pain, painful swallowing, white patches on her throat.  She is also been coughing but she thinks this is more from irritation on her throat.  Has also had sinus drainage.  Does not want to be tested for COVID-19.  No current facility-administered medications for this encounter.  Current Outpatient Medications:    loratadine (CLARITIN) 10 MG tablet, Take 10 mg by mouth daily., Disp: , Rfl:    nitrofurantoin, macrocrystal-monohydrate, (MACROBID) 100 MG capsule, Take 1 capsule (100 mg total) by mouth 2 (two) times daily., Disp: 10 capsule, Rfl: 0   No Known Allergies  Past Medical History:  Diagnosis Date   Allergy    Contraceptive management 10/28/2013   Tachycardia      History reviewed. No pertinent surgical history.  Family History  Problem Relation Age of Onset   Hypertension Father    Stroke Father    Kidney failure Father    Other Father        liver failure   Cancer Maternal Grandmother        stomach   Diabetes Maternal Grandfather    Heart disease Maternal Grandfather    Alzheimer's disease Paternal Grandfather     Social History   Tobacco Use   Smoking status: Former    Types: E-cigarettes   Smokeless tobacco: Never  Building services engineer Use: Former  Substance Use Topics   Alcohol use: Yes    Comment: rarely   Drug use: Yes    Frequency: 7.0 times per week    Types: Marijuana    ROS   Objective:   Vitals: BP 125/81 (BP Location: Right Arm)    Pulse 100    Temp 98.8 F (37.1 C) (Oral)    Resp 18    Ht 5\' 3"  (1.6 m)    Wt 145 lb (65.8 kg)    LMP 03/09/2021 (Approximate)    SpO2 96%    BMI 25.69 kg/m   Physical Exam Constitutional:      General: She is not in acute distress.    Appearance: Normal appearance. She is well-developed. She is not ill-appearing, toxic-appearing or  diaphoretic.  HENT:     Head: Normocephalic and atraumatic.     Nose: Nose normal.     Mouth/Throat:     Mouth: Mucous membranes are moist.     Pharynx: Posterior oropharyngeal erythema present. No pharyngeal swelling, oropharyngeal exudate or uvula swelling.     Tonsils: No tonsillar exudate or tonsillar abscesses. 0 on the right. 0 on the left.  Eyes:     General: No scleral icterus.       Right eye: No discharge.        Left eye: No discharge.     Extraocular Movements: Extraocular movements intact.  Cardiovascular:     Rate and Rhythm: Normal rate.  Pulmonary:     Effort: Pulmonary effort is normal.  Skin:    General: Skin is warm and dry.  Neurological:     General: No focal deficit present.     Mental Status: She is alert and oriented to person, place, and time.  Psychiatric:        Mood and Affect: Mood normal.        Behavior: Behavior normal.    Results for orders placed or performed during the hospital encounter  of 04/11/21 (from the past 24 hour(s))  POCT rapid strep A     Status: None   Collection Time: 04/11/21  4:01 PM  Result Value Ref Range   Rapid Strep A Screen Negative Negative    Assessment and Plan :   PDMP not reviewed this encounter.  1. Acute viral pharyngitis   2. Post-nasal drainage   3. Subacute cough    Strep culture pending.  Patient does have frank erythema and what appeared to be tonsil stones.  Recommend supportive care for suspected viral pharyngitis, strep culture pending.  In the event that she has significant worsening of symptoms, provided her with a prescription for amoxicillin.  Should her strep culture be negative and she has improvement with general supportive care, counseled to discard the prescription. Counseled patient on potential for adverse effects with medications prescribed/recommended today, ER and return-to-clinic precautions discussed, patient verbalized understanding.    Wallis Bamberg, PA-C 04/11/21 1627

## 2021-04-11 NOTE — ED Triage Notes (Signed)
Pt reports sore throat since last night. Pt states noticed "white patches" on throat this morning and wanted to be evaluated for strep throat.

## 2021-04-14 LAB — CULTURE, GROUP A STREP (THRC)

## 2021-08-05 DIAGNOSIS — L0390 Cellulitis, unspecified: Secondary | ICD-10-CM | POA: Diagnosis not present

## 2021-08-08 ENCOUNTER — Telehealth: Payer: Self-pay

## 2021-08-08 ENCOUNTER — Ambulatory Visit
Admission: EM | Admit: 2021-08-08 | Discharge: 2021-08-08 | Disposition: A | Discharge status: Home / Self Care | Payer: BC Managed Care – PPO | Attending: Family Medicine | Admitting: Family Medicine

## 2021-08-08 DIAGNOSIS — L03211 Cellulitis of face: Secondary | ICD-10-CM | POA: Diagnosis not present

## 2021-08-08 MED ORDER — CLINDAMYCIN HCL 300 MG PO CAPS: 300.0000 mg | ORAL_CAPSULE | Freq: Two times a day (BID) | ORAL | 0 refills | Status: DC

## 2021-08-08 MED ORDER — ONDANSETRON 4 MG PO TBDP: 4.0000 mg | ORAL_TABLET | Freq: Three times a day (TID) | ORAL | 0 refills | Status: DC | PRN

## 2021-08-08 NOTE — ED Provider Notes (Signed)
[facility]    CSN: [id number] Arrival date & time: 08/08/21  4235      History   Chief Complaint Chief Complaint  Patient presents with   Nausea   Emesis    HPI Vanessa Weaver is a 26 y.o. female.   Presenting today following up on cellulitis of bilateral earlobes that has been ongoing for over a week.  States she got her ears pierced and had immediate pain following piercing and subsequently over the next week worsening redness, swelling, drainage from the piercing sites.  Took the earrings out almost immediately but symptoms continue to get worse even with good home wound care.  Was seen by another provider 3 days ago, started on Bactrim but thinks the medication is making her sick as she is now having nausea and vomiting and not able to tolerate by mouth.  Denies fever, chills, sweats, body aches.  Does feel like the antibiotic has been helping reduce the infection.   Past Medical History:  Diagnosis Date   Allergy    Contraceptive management 10/28/2013   Tachycardia     Patient Active Problem List   Diagnosis Date Noted   Sinus tachycardia 08/03/2020   Encounter for gynecological examination with Papanicolaou smear of cervix 04/23/2019   Patient desires pregnancy 04/23/2019   Encounter for routine gynecological examination with Papanicolaou smear of cervix 10/19/2016   Encounter for surveillance of contraceptive pills 04/18/2016   Contraceptive management 10/28/2013    History reviewed. No pertinent surgical history.  OB History     Gravida  0   Para      Term      Preterm      AB      Living         SAB      IAB      Ectopic      Multiple      Live Births               Home Medications    Prior to Admission medications   Medication Sig Start Date End Date Taking? Authorizing Provider  ondansetron (ZOFRAN-ODT) 4 MG disintegrating tablet Take 1 tablet (4 mg total) by mouth every 8 (eight) hours as needed for nausea or  vomiting. 08/08/21  Yes Particia Nearing, PA-C  amoxicillin (AMOXIL) 500 MG capsule Take 1 capsule (500 mg total) by mouth 2 (two) times daily. 04/11/21   Wallis Bamberg, PA-C  benzonatate (TESSALON) 100 MG capsule Take 1-2 capsules (100-200 mg total) by mouth 3 (three) times daily as needed for cough. 04/11/21   Wallis Bamberg, PA-C  cetirizine (ZYRTEC ALLERGY) 10 MG tablet Take 1 tablet (10 mg total) by mouth daily. 04/11/21   Wallis Bamberg, PA-C  clindamycin (CLEOCIN) 300 MG capsule Take 1 capsule (300 mg total) by mouth 2 (two) times daily. 08/08/21   Particia Nearing, PA-C  promethazine-dextromethorphan (PROMETHAZINE-DM) 6.25-15 MG/5ML syrup Take 5 mLs by mouth at bedtime as needed for cough. 04/11/21   Wallis Bamberg, PA-C  pseudoephedrine (SUDAFED) 30 MG tablet Take 1 tablet (30 mg total) by mouth every 8 (eight) hours as needed for congestion. 04/11/21   Wallis Bamberg, PA-C  norethindrone-ethinyl estradiol (GILDESS FE 1/20) 1-20 MG-MCG tablet Take 1 tablet by mouth daily. 10/23/17 02/04/19  Adline Potter, NP    Family History Family History  Problem Relation Age of Onset   Hypertension Father    Stroke Father    Kidney failure Father  Other Father        liver failure   Cancer Maternal Grandmother        stomach   Diabetes Maternal Grandfather    Heart disease Maternal Grandfather    Alzheimer's disease Paternal Grandfather     Social History Social History   Tobacco Use   Smoking status: Former    Types: E-cigarettes   Smokeless tobacco: Never  Vaping Use   Vaping Use: Former  Substance Use Topics   Alcohol use: Yes    Comment: rarely   Drug use: Yes    Frequency: 7.0 times per week    Types: Marijuana     Allergies   Bactrim [sulfamethoxazole-trimethoprim]   Review of Systems Review of Systems Per HPI  Physical Exam Triage Vital Signs ED Triage Vitals  Enc Vitals Group     BP 08/08/21 0830 (!) 131/93     Pulse Rate 08/08/21 0830 (!) 112     Resp  08/08/21 0830 (!) 97     Temp 08/08/21 0830 98.3 F (36.8 C)     Temp src --      SpO2 08/08/21 0830 97 %     Weight --      Height --      Head Circumference --      Peak Flow --      Pain Score 08/08/21 0828 0     Pain Loc --      Pain Edu? --      Excl. in GC? --    No data found.  Updated Vital Signs BP (!) 131/93   Pulse (!) 112   Temp 98.3 F (36.8 C)   Resp (!) 97   LMP 07/25/2021   SpO2 97%   Visual Acuity Right Eye Distance:   Left Eye Distance:   Bilateral Distance:    Right Eye Near:   Left Eye Near:    Bilateral Near:     Physical Exam Vitals and nursing note reviewed.  Constitutional:      Appearance: Normal appearance. She is not ill-appearing.  HENT:     Head: Atraumatic.     Mouth/Throat:     Mouth: Mucous membranes are moist.  Eyes:     Extraocular Movements: Extraocular movements intact.     Conjunctiva/sclera: Conjunctivae normal.  Cardiovascular:     Rate and Rhythm: Normal rate and regular rhythm.     Heart sounds: Normal heart sounds.  Pulmonary:     Effort: Pulmonary effort is normal.     Breath sounds: Normal breath sounds.  Musculoskeletal:        General: Normal range of motion.     Cervical back: Normal range of motion and neck supple.  Skin:    General: Skin is warm.     Comments: Erythema, dried drainage extending from bilateral earlobes to periauricular areas bilaterally.  Per patient this has improved since starting antibiotics.  No mastoid tenderness to palpation bilaterally.  Neurological:     Mental Status: She is alert and oriented to person, place, and time.  Psychiatric:        Mood and Affect: Mood normal.        Thought Content: Thought content normal.        Judgment: Judgment normal.     UC Treatments / Results  Labs (all labs ordered are listed, but only abnormal results are displayed) Labs Reviewed - No data to display  EKG   Radiology No results found.  Procedures  Procedures (including critical  care time)  Medications Ordered in UC Medications - No data to display  Initial Impression / Assessment and Plan / UC Course  I have reviewed the triage vital signs and the nursing notes.  Pertinent labs & imaging results that were available during my care of the patient were reviewed by me and considered in my medical decision making (see chart for details).     We will switch to clindamycin as the Bactrim has been causing nausea and vomiting, Zofran to help with the symptoms as well.  Continue good home wound care, use of topical antibiotic ointment.  Return for worsening symptoms.   Final Clinical Impressions(s) / UC Diagnoses   Final diagnoses:  Cellulitis of face   Discharge Instructions   None    ED Prescriptions     Medication Sig Dispense Auth. Provider   clindamycin (CLEOCIN) 300 MG capsule Take 1 capsule (300 mg total) by mouth 2 (two) times daily. 14 capsule Particia NearingLane, Danyeal Akens Elizabeth, PA-C   ondansetron (ZOFRAN-ODT) 4 MG disintegrating tablet Take 1 tablet (4 mg total) by mouth every 8 (eight) hours as needed for nausea or vomiting. 20 tablet Particia NearingLane, Eartha Vonbehren [place], [place]      PDMP not reviewed this encounter.   Particia NearingLane, Giovani Neumeister [place], [place] 08/08/21 1036

## 2021-08-08 NOTE — ED Triage Notes (Signed)
Pt presents with bilateral ear infection from piercing, was prescribed bactrim and has been sick taking and unable to tolerate

## 2021-08-19 DIAGNOSIS — R457 State of emotional shock and stress, unspecified: Secondary | ICD-10-CM | POA: Diagnosis not present

## 2021-08-19 DIAGNOSIS — R000 Tachycardia, unspecified: Secondary | ICD-10-CM | POA: Diagnosis not present

## 2021-08-19 DIAGNOSIS — F32A Depression, unspecified: Secondary | ICD-10-CM | POA: Diagnosis not present

## 2021-09-20 ENCOUNTER — Encounter: Payer: Self-pay | Admitting: Emergency Medicine

## 2021-09-20 ENCOUNTER — Ambulatory Visit
Admission: EM | Admit: 2021-09-20 | Discharge: 2021-09-20 | Disposition: A | Discharge status: Home / Self Care | Payer: BC Managed Care – PPO | Attending: Family Medicine | Admitting: Family Medicine

## 2021-09-20 ENCOUNTER — Other Ambulatory Visit: Payer: Self-pay

## 2021-09-20 DIAGNOSIS — J039 Acute tonsillitis, unspecified: Secondary | ICD-10-CM

## 2021-09-20 DIAGNOSIS — R509 Fever, unspecified: Secondary | ICD-10-CM | POA: Diagnosis not present

## 2021-09-20 LAB — POCT RAPID STREP A (OFFICE): Rapid Strep A Screen: NEGATIVE

## 2021-09-20 MED ORDER — AMOXICILLIN 875 MG PO TABS: 875.0000 mg | ORAL_TABLET | Freq: Two times a day (BID) | ORAL | 0 refills | Status: DC

## 2021-09-20 NOTE — ED Triage Notes (Addendum)
Pt reports sore throat and general malaise since Monday. Pt reports diarrhea, chills, and fatigue this am.

## 2021-09-20 NOTE — ED Provider Notes (Signed)
[facility]    CSN: [id number] Arrival date & time: 09/20/21  0801      History   Chief Complaint Chief Complaint  Patient presents with   Sore Throat   HPI Vanessa Weaver is a 26 y.o. female.   Presenting today with 3-day history of progressively worsening sore throat, fever, malaise, chills.  States difficulty eating and swallowing due to pain and swollen sensation in throat.  To the back of her throat with a flashlight yesterday and saw lots of redness, swelling, white spots.  Denies cough, congestion, chest pain, shortness of breath, abdominal pain, nausea, vomiting.  Has been taking over-the-counter pain relievers with minimal relief.  No known sick contacts recently.    Past Medical History:  Diagnosis Date   Allergy    Contraceptive management 10/28/2013   Tachycardia     Patient Active Problem List   Diagnosis Date Noted   Sinus tachycardia 08/03/2020   Encounter for gynecological examination with Papanicolaou smear of cervix 04/23/2019   Patient desires pregnancy 04/23/2019   Encounter for routine gynecological examination with Papanicolaou smear of cervix 10/19/2016   Encounter for surveillance of contraceptive pills 04/18/2016   Contraceptive management 10/28/2013    History reviewed. No pertinent surgical history.  OB History     Gravida  0   Para      Term      Preterm      AB      Living         SAB      IAB      Ectopic      Multiple      Live Births               Home Medications    Prior to Admission medications   Medication Sig Start Date End Date Taking? Authorizing Provider  amoxicillin (AMOXIL) 875 MG tablet Take 1 tablet (875 mg total) by mouth 2 (two) times daily. 09/20/21  Yes Particia Nearing, PA-C  amoxicillin (AMOXIL) 500 MG capsule Take 1 capsule (500 mg total) by mouth 2 (two) times daily. 04/11/21   Wallis Bamberg, PA-C  benzonatate (TESSALON) 100 MG capsule Take 1-2 capsules (100-200 mg total) by  mouth 3 (three) times daily as needed for cough. 04/11/21   Wallis Bamberg, PA-C  cetirizine (ZYRTEC ALLERGY) 10 MG tablet Take 1 tablet (10 mg total) by mouth daily. 04/11/21   Wallis Bamberg, PA-C  clindamycin (CLEOCIN) 300 MG capsule Take 1 capsule (300 mg total) by mouth 2 (two) times daily. 08/08/21   Particia Nearing, PA-C  ondansetron (ZOFRAN-ODT) 4 MG disintegrating tablet Take 1 tablet (4 mg total) by mouth every 8 (eight) hours as needed for nausea or vomiting. 08/08/21   Particia Nearing, PA-C  promethazine-dextromethorphan (PROMETHAZINE-DM) 6.25-15 MG/5ML syrup Take 5 mLs by mouth at bedtime as needed for cough. 04/11/21   Wallis Bamberg, PA-C  pseudoephedrine (SUDAFED) 30 MG tablet Take 1 tablet (30 mg total) by mouth every 8 (eight) hours as needed for congestion. 04/11/21   Wallis Bamberg, PA-C  norethindrone-ethinyl estradiol (GILDESS FE 1/20) 1-20 MG-MCG tablet Take 1 tablet by mouth daily. 10/23/17 02/04/19  Adline Potter, NP    Family History Family History  Problem Relation Age of Onset   Hypertension Father    Stroke Father    Kidney failure Father    Other Father        liver failure   Cancer Maternal Grandmother  stomach   Diabetes Maternal Grandfather    Heart disease Maternal Grandfather    Alzheimer's disease Paternal Grandfather     Social History Social History   Tobacco Use   Smoking status: Former    Types: E-cigarettes   Smokeless tobacco: Never  Building services engineer Use: Former  Substance Use Topics   Alcohol use: Yes    Comment: rarely   Drug use: Yes    Frequency: 7.0 times per week    Types: Marijuana     Allergies   Bactrim [sulfamethoxazole-trimethoprim]   Review of Systems Review of Systems PER HPI  Physical Exam Triage Vital Signs ED Triage Vitals [09/20/21 0817]  Enc Vitals Group     BP 111/80     Pulse Rate (!) 127     Resp 18     Temp 98.6 F (37 C)     Temp Source Oral     SpO2 99 %     Weight      Height       Head Circumference      Peak Flow      Pain Score 10     Pain Loc      Pain Edu?      Excl. in GC?    No data found.  Updated Vital Signs BP 111/80 (BP Location: Right Arm)   Pulse (!) 127   Temp 98.6 F (37 C) (Oral)   Resp 18   LMP 08/31/2021 (Approximate)   SpO2 99%   Visual Acuity Right Eye Distance:   Left Eye Distance:   Bilateral Distance:    Right Eye Near:   Left Eye Near:    Bilateral Near:     Physical Exam Vitals and nursing note reviewed.  Constitutional:      Appearance: Normal appearance.  HENT:     Head: Atraumatic.     Right Ear: Tympanic membrane and external ear normal.     Left Ear: Tympanic membrane and external ear normal.     Nose: Nose normal.     Mouth/Throat:     Mouth: Mucous membranes are moist.     Pharynx: Oropharyngeal exudate and posterior oropharyngeal erythema present.     Comments: Palatal petechiae diffusely, bilateral tonsillar erythema, edema, mild exudates.  Uvula midline, oral airway patent Eyes:     Extraocular Movements: Extraocular movements intact.     Conjunctiva/sclera: Conjunctivae normal.  Cardiovascular:     Rate and Rhythm: Normal rate and regular rhythm.     Heart sounds: Normal heart sounds.  Pulmonary:     Effort: Pulmonary effort is normal.     Breath sounds: Normal breath sounds. No wheezing.  Musculoskeletal:        General: Normal range of motion.     Cervical back: Normal range of motion and neck supple.  Lymphadenopathy:     Cervical: Cervical adenopathy present.  Skin:    General: Skin is warm and dry.  Neurological:     Mental Status: She is alert and oriented to person, place, and time.  Psychiatric:        Mood and Affect: Mood normal.        Thought Content: Thought content normal.      UC Treatments / Results  Labs (all labs ordered are listed, but only abnormal results are displayed) Labs Reviewed  POCT RAPID STREP A (OFFICE)    EKG   Radiology No results  found.  Procedures Procedures (including critical care  time)  Medications Ordered in UC Medications - No data to display  Initial Impression / Assessment and Plan / UC Course  I have reviewed the triage vital signs and the nursing notes.  Pertinent labs & imaging results that were available during my care of the patient were reviewed by me and considered in my medical decision making (see chart for details).     Tachycardic in triage, otherwise vital signs reassuring.  Rapid strep negative, however given symptoms and exam findings will cover for bacterial tonsillitis with amoxicillin.  Discussed supportive care, return precautions.  Work note given.  Final Clinical Impressions(s) / UC Diagnoses   Final diagnoses:  Acute tonsillitis, unspecified etiology  Fever, unspecified   Discharge Instructions   None    ED Prescriptions     Medication Sig Dispense Auth. Provider   amoxicillin (AMOXIL) 875 MG tablet Take 1 tablet (875 mg total) by mouth 2 (two) times daily. 20 tablet Particia Nearing, [place]      PDMP not reviewed this encounter.   Particia Nearing, [place] 09/20/21 1347

## 2021-09-27 DIAGNOSIS — R000 Tachycardia, unspecified: Secondary | ICD-10-CM | POA: Diagnosis not present

## 2021-09-27 DIAGNOSIS — Z319 Encounter for procreative management, unspecified: Secondary | ICD-10-CM | POA: Diagnosis not present

## 2021-09-30 DIAGNOSIS — R000 Tachycardia, unspecified: Secondary | ICD-10-CM | POA: Diagnosis not present

## 2021-10-10 ENCOUNTER — Ambulatory Visit
Admission: EM | Admit: 2021-10-10 | Discharge: 2021-10-10 | Disposition: A | Discharge status: Home / Self Care | Payer: BC Managed Care – PPO | Attending: Nurse Practitioner | Admitting: Nurse Practitioner

## 2021-10-10 DIAGNOSIS — N898 Other specified noninflammatory disorders of vagina: Secondary | ICD-10-CM | POA: Insufficient documentation

## 2021-10-10 DIAGNOSIS — B3731 Acute candidiasis of vulva and vagina: Secondary | ICD-10-CM | POA: Diagnosis not present

## 2021-10-10 DIAGNOSIS — J029 Acute pharyngitis, unspecified: Secondary | ICD-10-CM | POA: Insufficient documentation

## 2021-10-10 LAB — POCT URINALYSIS DIP (MANUAL ENTRY)
Bilirubin, UA: NEGATIVE
Blood, UA: NEGATIVE
Glucose, UA: NEGATIVE mg/dL
Ketones, POC UA: NEGATIVE mg/dL
Nitrite, UA: NEGATIVE
Protein Ur, POC: NEGATIVE mg/dL
Spec Grav, UA: 1.02 (ref 1.010–1.025)
Urobilinogen, UA: 0.2 E.U./dL
pH, UA: 6.5 (ref 5.0–8.0)

## 2021-10-10 LAB — POCT URINE PREGNANCY: Preg Test, Ur: NEGATIVE

## 2021-10-10 LAB — POCT RAPID STREP A (OFFICE): Rapid Strep A Screen: NEGATIVE

## 2021-10-10 MED ORDER — PREDNISONE 20 MG PO TABS: 40.0000 mg | ORAL_TABLET | Freq: Every day | ORAL | 0 refills | Status: AC

## 2021-10-10 MED ORDER — NYSTATIN-TRIAMCINOLONE 100000-0.1 UNIT/GM-% EX CREA: TOPICAL_CREAM | CUTANEOUS | 0 refills | Status: DC

## 2021-10-10 NOTE — ED Provider Notes (Signed)
[facility]    CSN: [id number] Arrival date & time: 10/10/21  1011      History   Chief Complaint Chief Complaint  Patient presents with   Vaginal Discharge   Sore Throat         HPI Vanessa Weaver is a 26 y.o. female.   The history is provided by the patient.   Patient presents for a 2-day history of sore throat and to 3-week history of vaginal symptoms.  Patient states sore throat has waxed and waned since her symptoms started.  She denies fever, chills, nasal congestion, runny nose, abdominal pain, nausea, vomiting, or diarrhea.  She states that she has a mild cough that is productive of yellowish-green sputum.  She has been taking DayQuil for her symptoms.  Denies any known sick contacts.  Patient states she was treated for tonsillitis approximately 2 weeks ago with an antibiotic.  She also complains of vaginal discharge and vaginal irritation has been present for the past 2 to 3 weeks.  She states the discharge started approximately 1 week ago.  She states that she has "cuts" on her vagina.  She states they are not healing.  She states that she took over-the-counter Monistat for her vaginal discharge, which seemed to improve.  She currently is sexually active with 1 female partner over the past 90 days.  Last menstrual cycle was on 08/31/2021.  Past Medical History:  Diagnosis Date   Allergy    Contraceptive management 10/28/2013   Tachycardia     Patient Active Problem List   Diagnosis Date Noted   Sinus tachycardia 08/03/2020   Encounter for gynecological examination with Papanicolaou smear of cervix 04/23/2019   Patient desires pregnancy 04/23/2019   Encounter for routine gynecological examination with Papanicolaou smear of cervix 10/19/2016   Encounter for surveillance of contraceptive pills 04/18/2016   Contraceptive management 10/28/2013    History reviewed. No pertinent surgical history.  OB History     Gravida  0   Para      Term       Preterm      AB      Living         SAB      IAB      Ectopic      Multiple      Live Births               Home Medications    Prior to Admission medications   Medication Sig Start Date End Date Taking? Authorizing Provider  nystatin-triamcinolone (MYCOLOG II) cream Apply to affected area daily 10/10/21  Yes Tesa Meadors-Warren, Sadie Haber, NP  predniSONE (DELTASONE) 20 MG tablet Take 2 tablets (40 mg total) by mouth daily with breakfast for 5 days. 10/10/21 10/15/21 Yes Daquan Crapps-Warren, Sadie Haber, NP  amoxicillin (AMOXIL) 500 MG capsule Take 1 capsule (500 mg total) by mouth 2 (two) times daily. 04/11/21   Wallis Bamberg, PA-C  amoxicillin (AMOXIL) 875 MG tablet Take 1 tablet (875 mg total) by mouth 2 (two) times daily. 09/20/21   Particia Nearing, PA-C  benzonatate (TESSALON) 100 MG capsule Take 1-2 capsules (100-200 mg total) by mouth 3 (three) times daily as needed for cough. 04/11/21   Wallis Bamberg, PA-C  cetirizine (ZYRTEC ALLERGY) 10 MG tablet Take 1 tablet (10 mg total) by mouth daily. 04/11/21   Wallis Bamberg, PA-C  clindamycin (CLEOCIN) 300 MG capsule Take 1 capsule (300 mg total) by mouth 2 (two) times daily. 08/08/21  Particia Nearing, PA-C  ondansetron (ZOFRAN-ODT) 4 MG disintegrating tablet Take 1 tablet (4 mg total) by mouth every 8 (eight) hours as needed for nausea or vomiting. 08/08/21   Particia Nearing, PA-C  promethazine-dextromethorphan (PROMETHAZINE-DM) 6.25-15 MG/5ML syrup Take 5 mLs by mouth at bedtime as needed for cough. 04/11/21   Wallis Bamberg, PA-C  pseudoephedrine (SUDAFED) 30 MG tablet Take 1 tablet (30 mg total) by mouth every 8 (eight) hours as needed for congestion. 04/11/21   Wallis Bamberg, PA-C  norethindrone-ethinyl estradiol (GILDESS FE 1/20) 1-20 MG-MCG tablet Take 1 tablet by mouth daily. 10/23/17 02/04/19  Adline Potter, NP    Family History Family History  Problem Relation Age of Onset   Hypertension Father    Stroke Father    Kidney  failure Father    Other Father        liver failure   Cancer Maternal Grandmother        stomach   Diabetes Maternal Grandfather    Heart disease Maternal Grandfather    Alzheimer's disease Paternal Grandfather     Social History Social History   Tobacco Use   Smoking status: Former    Types: E-cigarettes   Smokeless tobacco: Never  Building services engineer Use: Former  Substance Use Topics   Alcohol use: Yes    Comment: rarely   Drug use: Yes    Frequency: 7.0 times per week    Types: Marijuana     Allergies   Bactrim [sulfamethoxazole-trimethoprim] and Sulfa antibiotics   Review of Systems Review of Systems Per HPI  Physical Exam Triage Vital Signs ED Triage Vitals [10/10/21 1028]  Enc Vitals Group     BP 119/82     Pulse Rate (!) 105     Resp 14     Temp 99 F (37.2 C)     Temp Source Oral     SpO2 98 %     Weight      Height      Head Circumference      Peak Flow      Pain Score      Pain Loc      Pain Edu?      Excl. in GC?    No data found.  Updated Vital Signs BP 119/82 (BP Location: Right Arm)   Pulse (!) 105   Temp 99 F (37.2 C) (Oral)   Resp 14   LMP 08/31/2021 (Approximate)   SpO2 98%   Visual Acuity Right Eye Distance:   Left Eye Distance:   Bilateral Distance:    Right Eye Near:   Left Eye Near:    Bilateral Near:     Physical Exam Vitals and nursing note reviewed.  Constitutional:      General: She is not in acute distress.    Appearance: She is well-developed.  HENT:     Head: Normocephalic.     Right Ear: Tympanic membrane and ear canal normal.     Left Ear: Ear canal normal.     Nose: No congestion.     Mouth/Throat:     Mouth: Mucous membranes are moist. No oral lesions.     Pharynx: Uvula midline. Pharyngeal swelling, posterior oropharyngeal erythema and uvula swelling present. No oropharyngeal exudate.     Tonsils: 1+ on the right. 1+ on the left.  Eyes:     Conjunctiva/sclera: Conjunctivae normal.     Pupils:  Pupils are equal, round, and reactive to light.  Cardiovascular:     Rate and Rhythm: Regular rhythm. Tachycardia present.     Heart sounds: Normal heart sounds.  Pulmonary:     Effort: Pulmonary effort is normal.     Breath sounds: Normal breath sounds.  Abdominal:     General: Bowel sounds are normal.     Palpations: Abdomen is soft.     Tenderness: There is no abdominal tenderness.  Genitourinary:    General: Normal vulva.     Labia:        Right: No rash, tenderness or lesion.        Left: No rash, tenderness or lesion.      Comments: Dryness noted to the symphysis pubis region. No lesions, rash or other skin disruption present.  Musculoskeletal:     Cervical back: Normal range of motion.  Lymphadenopathy:     Cervical: No cervical adenopathy.  Skin:    General: Skin is warm and dry.  Neurological:     General: No focal deficit present.     Mental Status: She is alert and oriented to person, place, and time.  Psychiatric:        Mood and Affect: Mood normal.        Behavior: Behavior normal.      UC Treatments / Results  Labs (all labs ordered are listed, but only abnormal results are displayed) Labs Reviewed  POCT URINALYSIS DIP (MANUAL ENTRY) - Abnormal; Notable for the following components:      Result Value   Leukocytes, UA Small (1+) (*)    All other components within normal limits  CULTURE, GROUP A STREP ([facility])  URINE CULTURE  POCT RAPID STREP A (OFFICE)  POCT URINE PREGNANCY  CERVICOVAGINAL ANCILLARY ONLY    EKG   Radiology No results found.  Procedures Procedures (including critical care time)  Medications Ordered in UC Medications - No data to display  Initial Impression / Assessment and Plan / UC Course  I have reviewed the triage vital signs and the nursing notes.  Pertinent labs & imaging results that were available during my care of the patient were reviewed by me and considered in my medical decision making (see chart for  details).  Patient presents for complaints of sore throat and vaginal symptoms.  Sore throat is been present for the past 4 days with vaginal symptoms present for the past 3 weeks.  Patient's vital signs are stable, exam is benign.  Patient is tachycardic, but this appears to be part of her underlying health history.  On exam, her uvula is swollen and erythematous, there is no exudate present.  With regard to her vaginal symptoms, no obvious vaginal lesions; she does have dryness to the pubic symphysis region.  Urinalysis shows small leukocytes, will culture, but do not think this is indicative of a urinary tract infection.  Urine pregnancy is also negative.  Will prescribe Mycolog cream for her vaginal symptoms.  For her throat, throat culture is pending.   Will also start patient on prednisone since she just completed a round of antibiotics.  Supportive care recommendations were provided to the patient.  Patient advised to follow-up if symptoms do not improve. Final Clinical Impressions(s) / UC Diagnoses   Final diagnoses:  Vaginal discharge  Acute sore throat     Discharge Instructions      The rapid strep test is negative, throat culture is pending.  Take medication as prescribed. Increase fluids and allow for plenty of rest. Recommend Tylenol or ibuprofen as  needed for pain, fever, or general discomfort. Recommend throat lozenges, Chloraseptic or honey to help with throat pain. Warm salt water gargles 3-4 times daily to help with throat pain or discomfort. Recommend a diet with soft foods to include soups, broths, puddings, yogurt, Jell-O's, or popsicles until symptoms improve.  Warm sitz baths to help with vaginal irritation. Cleanse the vaginal area with warm water as persist.  Avoid use of soaps, body washes, or cleansing agents that contain heavy dyes and perfumes. Refrain from sexual activity while symptoms persist until your cytology results are received. You will be contacted if  your cytology results are positive.  Follow-up if symptoms do not improve.       ED Prescriptions     Medication Sig Dispense Auth. Provider   predniSONE (DELTASONE) 20 MG tablet Take 2 tablets (40 mg total) by mouth daily with breakfast for 5 days. 10 tablet Sekai Gitlin-Warren, Sadie Haber, NP   nystatin-triamcinolone (MYCOLOG II) cream Apply to affected area daily 15 g Nicha Hemann-Warren, Sadie Haber, NP      PDMP not reviewed this encounter.   Abran Cantor, NP 10/10/21 1106

## 2021-10-10 NOTE — Discharge Instructions (Addendum)
The rapid strep test is negative, throat culture is pending.  Take medication as prescribed. Increase fluids and allow for plenty of rest. Recommend Tylenol or ibuprofen as needed for pain, fever, or general discomfort. Recommend throat lozenges, Chloraseptic or honey to help with throat pain. Warm salt water gargles 3-4 times daily to help with throat pain or discomfort. Recommend a diet with soft foods to include soups, broths, puddings, yogurt, Jell-O's, or popsicles until symptoms improve.  Warm sitz baths to help with vaginal irritation. Cleanse the vaginal area with warm water as persist.  Avoid use of soaps, body washes, or cleansing agents that contain heavy dyes and perfumes. Refrain from sexual activity while symptoms persist until your cytology results are received. You will be contacted if your cytology results are positive.  Follow-up if symptoms do not improve.

## 2021-10-10 NOTE — ED Triage Notes (Signed)
Pt reports sore throat x 4 days. DayQuil gives no relief.   Pt reports white vaginal discharge x 1 week; vaginal cuts x 2-3 weeks.

## 2021-10-11 ENCOUNTER — Telehealth (HOSPITAL_COMMUNITY): Payer: Self-pay | Admitting: Emergency Medicine

## 2021-10-11 LAB — CERVICOVAGINAL ANCILLARY ONLY
Bacterial Vaginitis (gardnerella): NEGATIVE
Candida Glabrata: NEGATIVE
Candida Vaginitis: POSITIVE — AB
Chlamydia: NEGATIVE
Comment: NEGATIVE
Comment: NEGATIVE
Comment: NEGATIVE
Comment: NEGATIVE
Comment: NEGATIVE
Comment: NORMAL
Neisseria Gonorrhea: NEGATIVE
Trichomonas: NEGATIVE

## 2021-10-11 LAB — CULTURE, GROUP A STREP (THRC)

## 2021-10-11 MED ORDER — CEFDINIR 300 MG PO CAPS: 300.0000 mg | ORAL_CAPSULE | Freq: Two times a day (BID) | ORAL | 0 refills | Status: AC

## 2021-10-11 MED ORDER — FLUCONAZOLE 150 MG PO TABS: 150.0000 mg | ORAL_TABLET | Freq: Once | ORAL | 0 refills | Status: AC

## 2021-10-12 LAB — URINE CULTURE: Culture: 50000 — AB

## 2021-10-19 DIAGNOSIS — Z6823 Body mass index (BMI) 23.0-23.9, adult: Secondary | ICD-10-CM | POA: Diagnosis not present

## 2021-10-19 DIAGNOSIS — R000 Tachycardia, unspecified: Secondary | ICD-10-CM | POA: Diagnosis not present

## 2021-10-19 DIAGNOSIS — Z319 Encounter for procreative management, unspecified: Secondary | ICD-10-CM | POA: Diagnosis not present

## 2021-10-24 NOTE — Progress Notes (Unsigned)
Cardiology Clinic Note   Patient Name: Vanessa Weaver Date of Encounter: 10/25/2021  Primary Care Provider:  Benita Stabile, MD Primary Cardiologist:  None  Patient Profile    Vanessa Weaver 26 year old female presents to the clinic today for evaluation of her tachycardia.  Past Medical History    Past Medical History:  Diagnosis Date   Allergy    Contraceptive management 10/28/2013   Tachycardia    History reviewed. No pertinent surgical history.  Allergies  Allergies  Allergen Reactions   Bactrim [Sulfamethoxazole-Trimethoprim] Nausea And Vomiting   Sulfa Antibiotics     Other reaction(s): nausea and vomiting    History of Present Illness    Vanessa Weaver has a PMH of sinus tachycardia, facial cellulitis, and tonsillitis.  Echocardiogram 09/02/2020 showed an LVEF of 55-60%, normal diastolic parameters and no valvular abnormalities.  Cardiac event monitor 08/24/2020 showed minimum heart rate of 49, maximum heart rate 163 and average heart rate of 94 bpm she was noted to have isolated episodes of SVE less than 1% isolated VE's less than 1%, and occasional PVCs and PACs.  She was referred by her PCP for evaluation of her tachycardia.  She reports that she is trying to get pregnant.  Her EKG today shows normal sinus rhythm.  She reports not drinking caffeinated beverages.  We reviewed triggers for palpitations.  She visited her PCP prescribed propranolol.  She reported not tolerating it well due to low blood pressure and that was if she had irregular heart rate.  She noted increased heart rate initially after stopping antidepressant type medication.  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  She does report 1 episode of palpitations that lasted for 10-15 minutes.  EMS was contacted.  An EKG showed sinus tachycardia.  We reviewed the importance of increased p.o. hydration and increase physical activity.  I will have her return to clinic in 9-12  months.    Home Medications    Prior to Admission medications   Medication Sig Start Date End Date Taking? Authorizing Provider  benzonatate (TESSALON) 100 MG capsule Take 1-2 capsules (100-200 mg total) by mouth 3 (three) times daily as needed for cough. 04/11/21   Wallis Bamberg, PA-C  cetirizine (ZYRTEC ALLERGY) 10 MG tablet Take 1 tablet (10 mg total) by mouth daily. 04/11/21   Wallis Bamberg, PA-C  nystatin-triamcinolone (MYCOLOG II) cream Apply to affected area daily 10/10/21   Leath-Warren, Sadie Haber, NP  ondansetron (ZOFRAN-ODT) 4 MG disintegrating tablet Take 1 tablet (4 mg total) by mouth every 8 (eight) hours as needed for nausea or vomiting. 08/08/21   Particia Nearing, PA-C  promethazine-dextromethorphan (PROMETHAZINE-DM) 6.25-15 MG/5ML syrup Take 5 mLs by mouth at bedtime as needed for cough. 04/11/21   Wallis Bamberg, PA-C  pseudoephedrine (SUDAFED) 30 MG tablet Take 1 tablet (30 mg total) by mouth every 8 (eight) hours as needed for congestion. 04/11/21   Wallis Bamberg, PA-C  norethindrone-ethinyl estradiol (GILDESS FE 1/20) 1-20 MG-MCG tablet Take 1 tablet by mouth daily. 10/23/17 02/04/19  Adline Potter, NP    Family History    Family History  Problem Relation Age of Onset   Hypertension Father    Stroke Father    Kidney failure Father    Other Father        liver failure   Cancer Maternal Grandmother        stomach   Diabetes Maternal Grandfather    Heart disease  Maternal Grandfather    Alzheimer's disease Paternal Grandfather    She indicated that her mother is alive. She indicated that her father is deceased. She indicated that her sister is alive. She indicated that her maternal grandmother is deceased. She indicated that her maternal grandfather is deceased. She indicated that her paternal grandmother is alive. She indicated that her paternal grandfather is deceased.  Social History    Social History   Socioeconomic History   Marital status: Married    Spouse  name: Not on file   Number of children: Not on file   Years of education: Not on file   Highest education level: Not on file  Occupational History   Not on file  Tobacco Use   Smoking status: Former    Types: E-cigarettes   Smokeless tobacco: Never  Vaping Use   Vaping Use: Former  Substance and Sexual Activity   Alcohol use: Yes    Comment: rarely   Drug use: Yes    Frequency: 7.0 times per week    Types: Marijuana   Sexual activity: Yes    Birth control/protection: None  Other Topics Concern   Not on file  Social History Narrative   Not on file   Social Determinants of Health   Financial Resource Strain: Not on file  Food Insecurity: Not on file  Transportation Needs: Not on file  Physical Activity: Not on file  Stress: Not on file  Social Connections: Not on file  Intimate Partner Violence: Not on file     Review of Systems    General:  No chills, fever, night sweats or weight changes.  Cardiovascular:  No chest pain, dyspnea on exertion, edema, orthopnea, palpitations, paroxysmal nocturnal dyspnea. Dermatological: No rash, lesions/masses Respiratory: No cough, dyspnea Urologic: No hematuria, dysuria Abdominal:   No nausea, vomiting, diarrhea, bright red blood per rectum, melena, or hematemesis Neurologic:  No visual changes, wkns, changes in mental status. All other systems reviewed and are otherwise negative except as noted above.  Physical Exam    VS:  BP 104/68   Pulse 86   Ht 5\' 3"  (1.6 m)   Wt 134 lb 12.8 oz (61.1 kg)   SpO2 99%   BMI 23.88 kg/m  , BMI Body mass index is 23.88 kg/m. GEN: Well nourished, well developed, in no acute distress. HEENT: normal. Neck: Supple, no JVD, carotid bruits, or masses. Cardiac: RRR, no murmurs, rubs, or gallops. No clubbing, cyanosis, edema.  Radials/DP/PT 2+ and equal bilaterally.  Respiratory:  Respirations regular and unlabored, clear to auscultation bilaterally. GI: Soft, nontender, nondistended, BS + x  4. MS: no deformity or atrophy. Skin: warm and dry, no rash. Neuro:  Strength and sensation are intact. Psych: Normal affect.  Accessory Clinical Findings    Recent Labs: No results found for requested labs within last 365 days.   Recent Lipid Panel No results found for: "CHOL", "TRIG", "HDL", "CHOLHDL", "VLDL", "LDLCALC", "LDLDIRECT"  ECG personally reviewed by me today-normal sinus rhythm incomplete right bundle branch block 86 bpm- No acute changes  Echocardiogram 09/02/2020  IMPRESSIONS     1. Left ventricular ejection fraction, by estimation, is 55 to 60%. Left  ventricular ejection fraction by 3D volume is 57 %. The left ventricle has  normal function. The left ventricle has no regional wall motion  abnormalities. Left ventricular diastolic   parameters were normal. The average left ventricular global longitudinal  strain is -20.5 %. The global longitudinal strain is normal.   2.  Right ventricular systolic function is normal. The right ventricular  size is normal. Tricuspid regurgitation signal is inadequate for assessing  PA pressure.   3. The mitral valve is normal in structure. No evidence of mitral valve  regurgitation. No evidence of mitral stenosis.   4. The aortic valve is tricuspid. Aortic valve regurgitation is not  visualized. No aortic stenosis is present.   5. The inferior vena cava is normal in size with greater than 50%  respiratory variability, suggesting right atrial pressure of 3 mmHg.   Comparison(s): No prior Echocardiogram.   Conclusion(s)/Recommendation(s): Normal biventricular function without  evidence of hemodynamically significant valvular heart disease.  Assessment & Plan   1.  Tachycardia-EKG today shows normal sinus rhythm incomplete right bundle branch block 86 bpm.  Recommended not starting beta-blocker therapy at this time due to childbearing age and actively trying to conceive.  Reports that she previously did not tolerate  propranolol. Continue to monitor Heart healthy low-sodium diet Increase physical activity as tolerated-goal 150 minutes of moderate physical activity per week Avoid triggers caffeine, chocolate, EtOH, dehydration etc. Increase p.o. hydration  Disposition: Follow-up with Dr.Berry in 9-12 months.   Thomasene Ripple. Terriann Difonzo NP-C     10/25/2021, 9:48 AM [facility] [address] Office [phone] Fax [phone]  Notice: This dictation was prepared with Dragon dictation along with smaller phrase technology. Any transcriptional errors that result from this process are unintentional and may not be corrected upon review.  I spent 14 minutes examining this patient, reviewing medications, and using patient centered shared decision making involving her cardiac care.  Prior to her visit I spent greater than 20 minutes reviewing her past medical history,  medications, and prior cardiac tests.

## 2021-10-25 ENCOUNTER — Encounter: Payer: Self-pay | Admitting: General Practice

## 2021-10-25 ENCOUNTER — Ambulatory Visit: Payer: BC Managed Care – PPO | Admitting: General Practice

## 2021-10-25 VITALS — BP 104/68 | HR 86 | Ht 63.0 in | Wt 134.8 lb

## 2021-10-25 DIAGNOSIS — R000 Tachycardia, unspecified: Secondary | ICD-10-CM

## 2021-10-25 NOTE — Patient Instructions (Signed)
Medication Instructions:  The current medical regimen is effective;  continue present plan and medications as directed. Please refer to the Current Medication list given to you today.   *If you need a refill on your cardiac medications before your next appointment, please call your pharmacy*  Lab Work:   Testing/Procedures:  NONE    NONE  If you have labs (blood work) drawn today and your tests are completely normal, you will receive your results only by:  1-MyChart Message (if you have MyChart) OR 2- A paper copy in the mail.  If you have any lab test that is abnormal or we need to change your treatment, we will call you to review the results.  Special Instructions PLEASE READ AND FOLLOW STRESS REDUCTION TIPS-ATTACHED  PLEASE INCREASE PHYSICAL ACTIVITY AS TOLERATED-150 MIN/MODERATE PHYSICAL ACTIVITY WEEKLY  PLEASE INCREASE HYDRATION ALTERNATE WATER AND SPORTS DRINKS 62+ OUNCES  Follow-Up: Your next appointment:  9-12 month(s) In Person with Nanetta Batty, MD    Please call our office 2 months in advance to schedule this appointment  :1  At [facility], you and your health needs are our priority.  As part of our continuing mission to provide you with exceptional heart care, we have created designated Provider Care Teams.  These Care Teams include your primary Cardiologist (physician) and Advanced Practice Providers (APPs -  Physician Assistants and Nurse Practitioners) who all work together to provide you with the care you need, when you need it.  Important Information About Sugar          Mindfulness-Based Stress Reduction Mindfulness-based stress reduction (MBSR) is a program that helps people learn to practice mindfulness. Mindfulness is the practice of consciously paying attention to the present moment. MBSR focuses on developing self-awareness, which lets you respond to life stress without judgment or negative feelings. It can be learned and practiced through techniques such as  education, breathing exercises, meditation, and yoga. MBSR includes several mindfulness techniques in one program. MBSR works best when you understand the treatment, are willing to try new things, and can commit to spending time practicing what you learn. MBSR training may include learning about: How your feelings, thoughts, and reactions affect your body. New ways to respond to things that cause negative thoughts to start (triggers). How to notice your thoughts and let go of them. Practicing awareness of everyday things that you normally do without thinking. The techniques and goals of different types of meditation. What are the benefits of MBSR? MBSR can have many benefits, which include helping you to: Develop self-awareness. This means knowing and understanding yourself. Learn skills and attitudes that help you to take part in your own health care. Learn new ways to care for yourself. Be more accepting about how things are, and let things go. Be less judgmental and approach things with an open mind. Be patient with yourself and trust yourself more. MBSR has also been shown to: Reduce negative emotions, such as sadness, overwhelm, and worry. Improve memory and focus. Change how you sense and react to pain. Boost your body's ability to fight infections. Help you connect better with other people. Improve your sense of well-being. How to practice mindfulness To do a basic awareness exercise: Find a comfortable place to sit. Pay attention to the present moment. Notice your thoughts, feelings, and surroundings just as they are. Avoid judging yourself, your feelings, or your surroundings. Make note of any judgment that comes up and let it go. Your mind may wander, and that  is okay. Make note of when your thoughts drift, and return your attention to the present moment. To do basic mindfulness meditation: Find a comfortable place to sit. This may include a stable chair or a firm floor  cushion. Sit upright with your back straight. Let your arms fall next to your sides, with your hands resting on your legs. If you are sitting in a chair, rest your feet flat on the floor. If you are sitting on a cushion, cross your legs in front of you. Keep your head in a neutral position with your chin dropped slightly. Relax your jaw and rest the tip of your tongue on the roof of your mouth. Drop your gaze to the floor or close your eyes. Breathe normally and pay attention to your breath. Feel the air moving in and out of your nose. Feel your belly expanding and relaxing with each breath. Your mind may wander, and that is okay. Make note of when your thoughts drift, and return your attention to your breath. Avoid judging yourself, your feelings, or your surroundings. Make note of any judgment or feelings that come up, let them go, and bring your attention back to your breath. When you are ready, lift your gaze or open your eyes. Pay attention to how your body feels after the meditation. Follow these instructions at home:  Find a local in-person or online MBSR program. Set aside some time regularly for mindfulness practice. Practice every day if you can. Even 10 minutes of practice is helpful. Find a mindfulness practice that works best for you. This may include one or more of the following: Meditation. This involves focusing your mind on a certain thought or activity. Breathing awareness exercises. These help you to stay present by focusing on your breath. Body scan. For this practice, you lie down and pay attention to each part of your body from head to toe. You can identify tension and soreness and consciously relax parts of your body. Yoga. Yoga involves stretching and breathing, and it can improve your ability to move and be flexible. It can also help you to test your body's limits, which can help you release stress. Mindful eating. This way of eating involves focusing on the taste, texture,  color, and smell of each bite of food. This slows down eating and helps you feel full sooner. For this reason, it can be an important part of a weight loss plan. Find a podcast or recording that provides guidance for breathing awareness, body scan, or meditation exercises. You can listen to these any time when you have a free moment to rest without distractions. Follow your treatment plan as told by your health care provider. This may include taking regular medicines and making changes to your diet or lifestyle as recommended. Where to find more information You can find more information about MBSR from: Your health care provider. Community-based meditation centers or programs. Programs offered near you. Summary Mindfulness-based stress reduction (MBSR) is a program that teaches you how to consciously pay attention to the present moment. It is used to help you deal better with daily stress, feelings, and pain. MBSR focuses on developing self-awareness, which allows you to respond to life stress without judgment or negative feelings. MBSR programs may involve learning different mindfulness practices, such as breathing exercises, meditation, yoga, body scan, or mindful eating. Find a mindfulness practice that works best for you, and set aside time for it on a regular basis. This information is not intended to replace  advice given to you by your health care provider. Make sure you discuss any questions you have with your health care provider. Document Revised: 10/07/2020 Document Reviewed: 10/07/2020 Elsevier Patient Education  2023 [employer].

## 2021-11-02 DIAGNOSIS — Z319 Encounter for procreative management, unspecified: Secondary | ICD-10-CM | POA: Diagnosis not present

## 2021-11-02 DIAGNOSIS — N930 Postcoital and contact bleeding: Secondary | ICD-10-CM | POA: Diagnosis not present

## 2021-11-02 DIAGNOSIS — Z124 Encounter for screening for malignant neoplasm of cervix: Secondary | ICD-10-CM | POA: Diagnosis not present

## 2021-11-02 DIAGNOSIS — N946 Dysmenorrhea, unspecified: Secondary | ICD-10-CM | POA: Diagnosis not present

## 2021-11-11 DIAGNOSIS — N946 Dysmenorrhea, unspecified: Secondary | ICD-10-CM | POA: Diagnosis not present

## 2021-11-11 DIAGNOSIS — Z319 Encounter for procreative management, unspecified: Secondary | ICD-10-CM | POA: Diagnosis not present

## 2021-11-15 IMAGING — DX DG CHEST 2V
2 series · 2 of 2 positions shown · non-contrast
Comparison: None.

CLINICAL DATA: Cough and chest congestion

EXAM:
CHEST - 2 VIEW

[chest pa]
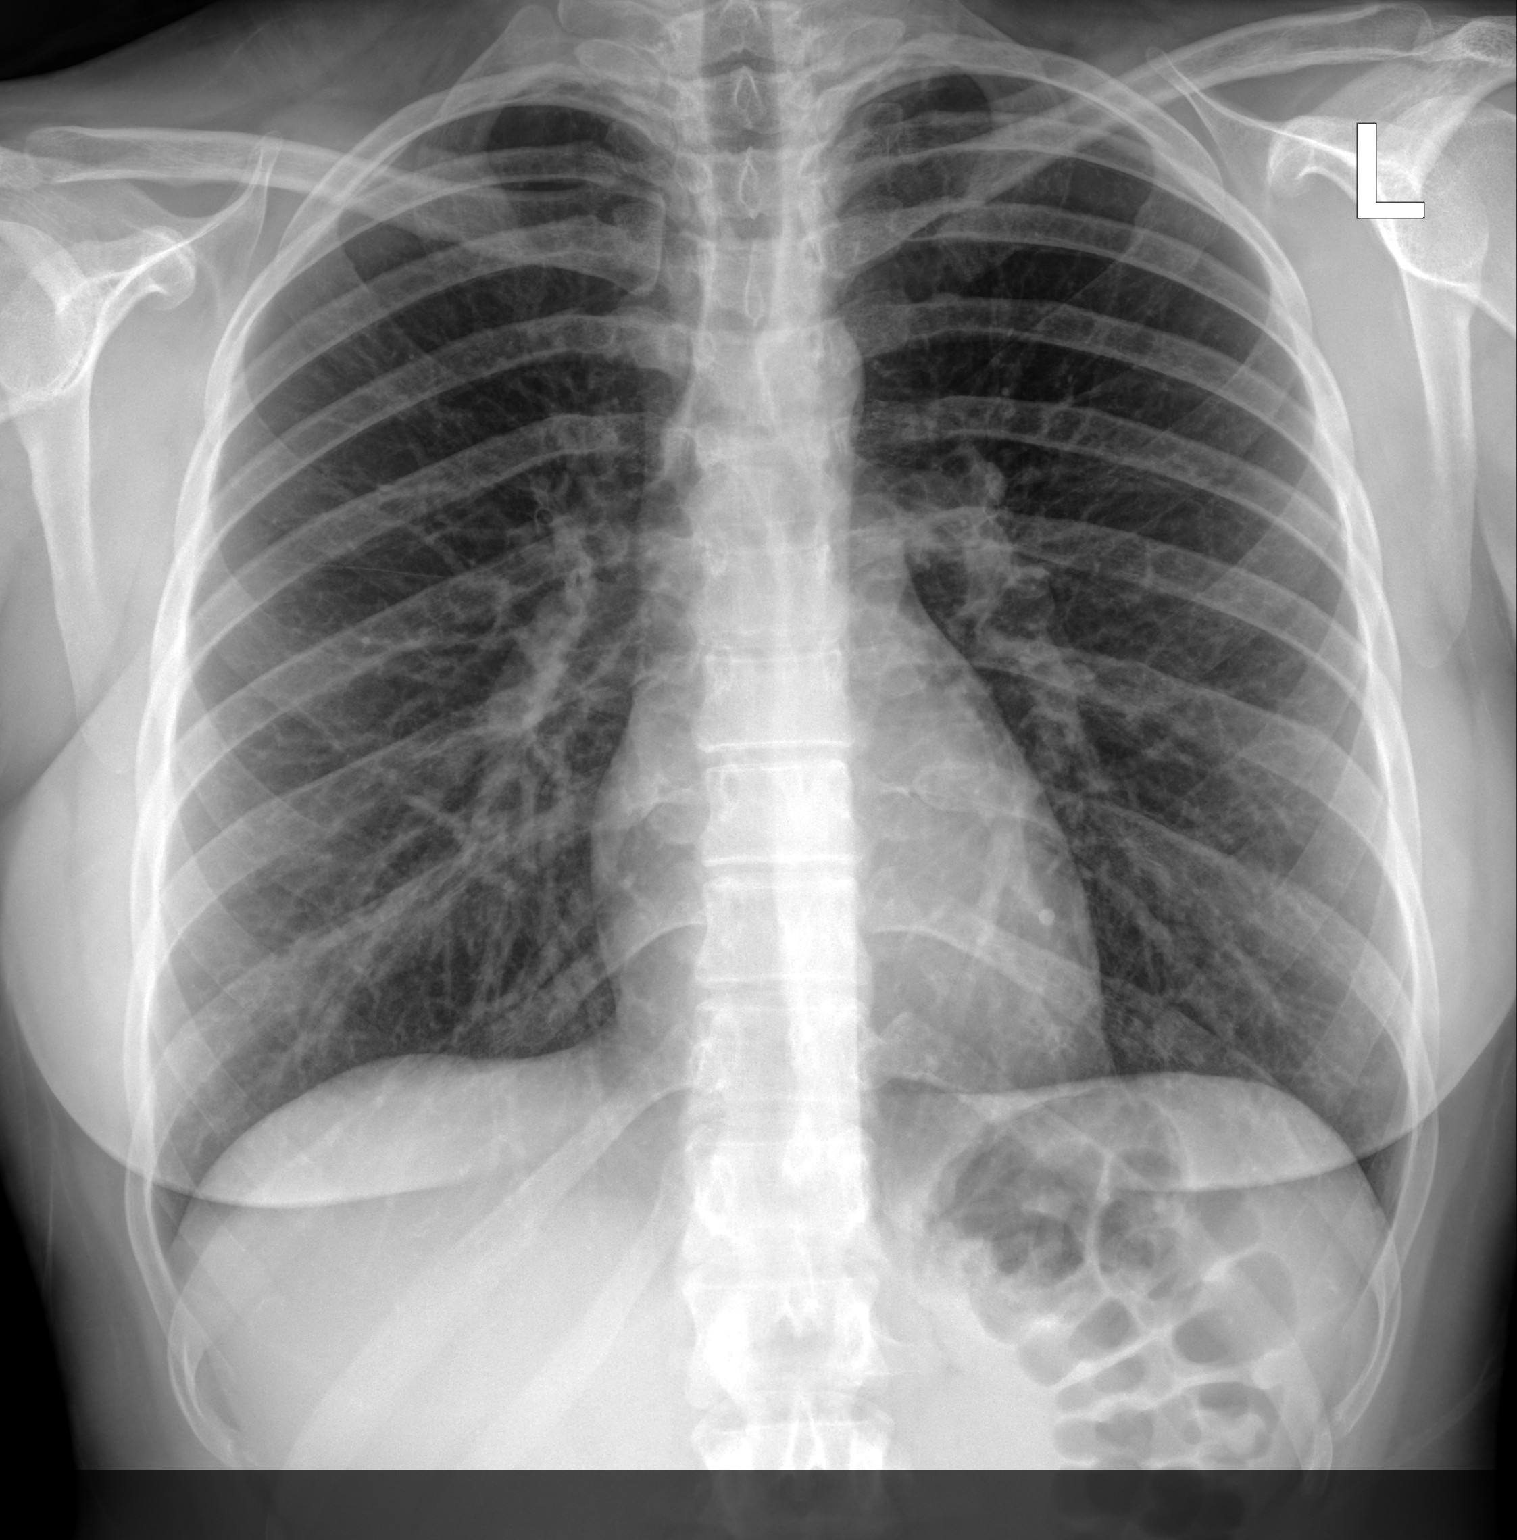

[chest lat]
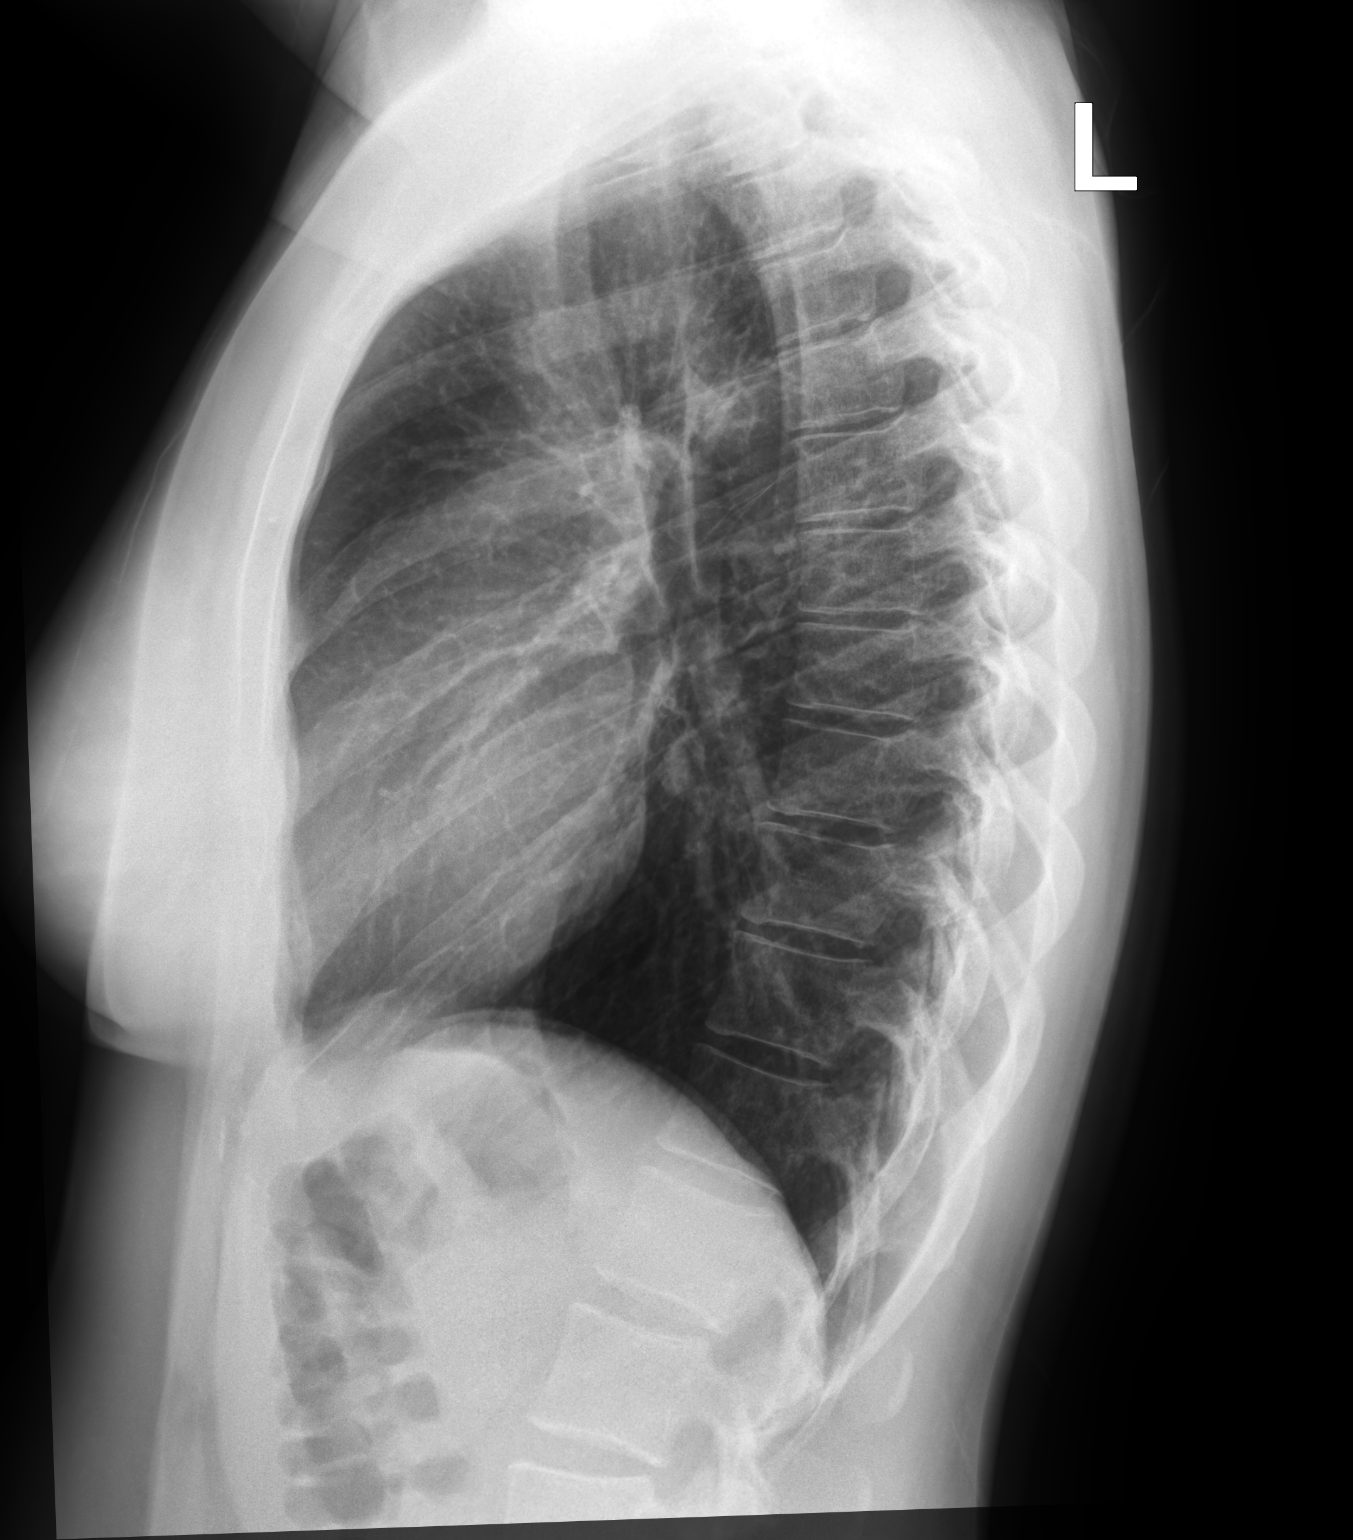

[2 of 2 positions shown; findings below may reference images not displayed]

FINDINGS: The heart size and mediastinal contours are within normal limits.
Both lungs are clear. The visualized skeletal structures are
unremarkable.
IMPRESSION: No active cardiopulmonary disease.

## 2021-12-13 DIAGNOSIS — Z319 Encounter for procreative management, unspecified: Secondary | ICD-10-CM | POA: Diagnosis not present

## 2022-01-06 ENCOUNTER — Other Ambulatory Visit: Payer: Self-pay

## 2022-01-06 ENCOUNTER — Ambulatory Visit
Admission: EM | Admit: 2022-01-06 | Discharge: 2022-01-06 | Disposition: A | Discharge status: Home / Self Care | Payer: BC Managed Care – PPO | Attending: Nurse Practitioner | Admitting: Nurse Practitioner

## 2022-01-06 DIAGNOSIS — J029 Acute pharyngitis, unspecified: Secondary | ICD-10-CM

## 2022-01-06 DIAGNOSIS — J069 Acute upper respiratory infection, unspecified: Secondary | ICD-10-CM

## 2022-01-06 DIAGNOSIS — Z1152 Encounter for screening for COVID-19: Secondary | ICD-10-CM | POA: Diagnosis not present

## 2022-01-06 DIAGNOSIS — R0602 Shortness of breath: Secondary | ICD-10-CM | POA: Diagnosis not present

## 2022-01-06 HISTORY — DX: Polycystic ovarian syndrome: E28.2

## 2022-01-06 NOTE — Discharge Instructions (Addendum)
Your vital signs are excellent today and EKG is unchanged from your last EKG in August.  I think that the pain in your head, breathing trouble, nasal congestion, and other symptoms are secondary to a viral upper respiratory infection.  We have tested you for COVID-19 today.  Your symptoms should not last more than 10 days and they should start to improve during that timeframe.  If you develop severe chest pain or shortness of breath that does not improve with rest, please go to the emergency room or call 911.    You will see the results in Mychart and we will call you with positive results.    Please stay home and isolate until you are aware of the results.    Some things that can make you feel better are: - Increased rest - Increasing fluid with water/sugar free electrolytes - Acetaminophen and ibuprofen as needed for fever/pain  - Salt water gargling, chloraseptic spray and throat lozenges - OTC guaifenesin (Mucinex) for congestion - Saline sinus flushes or a neti pot  - Humidifying the air

## 2022-01-06 NOTE — ED Triage Notes (Signed)
Pt reports headaches, chest tightness, feels like she will faint like she's not getting enough  o2. Took tylenol yesterday but no relief. Took allegra today with slight relief. Feels fatigued.

## 2022-01-06 NOTE — ED Provider Notes (Signed)
[facility]    CSN: [id number] Arrival date & time: 01/06/22  1215      History   Chief Complaint No chief complaint on file.   HPI Vanessa Weaver is a 26 y.o. female.   Patient presents with 2 days of nasal congestion, increasing sneezing, headache, fatigue, and feeling like she cannot take in a deep breath.  She reports the shortness of breath is worse with activity and comes and goes when she moves around.  She denies fever, body aches, chills, cough, chest pain or chest tightness, chest congestion, runny nose, postnasal drainage, sore throat, ear pain or pressure, abdominal pain, nausea/vomiting, diarrhea, decreased appetite, loss of taste or smell, and new rash.  Reports her husband was sick last week, however is feeling better now.  Has taken Allegra for symptoms which has not helped.    Past Medical History:  Diagnosis Date   Allergy    Contraceptive management 10/28/2013   PCOS (polycystic ovarian syndrome)    Tachycardia     Patient Active Problem List   Diagnosis Date Noted   Sinus tachycardia 08/03/2020   Encounter for gynecological examination with Papanicolaou smear of cervix 04/23/2019   Patient desires pregnancy 04/23/2019   Encounter for routine gynecological examination with Papanicolaou smear of cervix 10/19/2016   Encounter for surveillance of contraceptive pills 04/18/2016   Contraceptive management 10/28/2013    No past surgical history on file.  OB History     Gravida  0   Para      Term      Preterm      AB      Living         SAB      IAB      Ectopic      Multiple      Live Births               Home Medications    Prior to Admission medications   Medication Sig Start Date End Date Taking? Authorizing Provider  letrozole (FEMARA) 2.5 MG tablet Take 2.5 mg by mouth daily. 2 tabs (5mg ) around menstruation time.   Yes [provider]  norethindrone-ethinyl estradiol (GILDESS FE 1/20) 1-20 MG-MCG  tablet Take 1 tablet by mouth daily. 10/23/17 02/04/19  Estill Dooms, NP    Family History Family History  Problem Relation Age of Onset   Hypertension Father    Stroke Father    Kidney failure Father    Other Father        liver failure   Cancer Maternal Grandmother        stomach   Diabetes Maternal Grandfather    Heart disease Maternal Grandfather    Alzheimer's disease Paternal Grandfather     Social History Social History   Tobacco Use   Smoking status: Former    Types: E-cigarettes   Smokeless tobacco: Never  Vaping Use   Vaping Use: Former  Substance Use Topics   Alcohol use: Yes    Comment: rarely   Drug use: Yes    Frequency: 7.0 times per week    Types: Marijuana     Allergies   Bactrim [sulfamethoxazole-trimethoprim] and Sulfa antibiotics   Review of Systems Review of Systems Per HPI  Physical Exam Triage Vital Signs ED Triage Vitals  Enc Vitals Group     BP 01/06/22 1318 132/82     Pulse Rate 01/06/22 1318 (!) 111     Resp 01/06/22 1318 20  Temp 01/06/22 1318 98.1 F (36.7 C)     Temp Source 01/06/22 1318 Oral     SpO2 01/06/22 1318 99 %     Weight --      Height --      Head Circumference --      Peak Flow --      Pain Score 01/06/22 1322 0     Pain Loc --      Pain Edu? --      Excl. in GC? --    No data found.  Updated Vital Signs BP 132/82 (BP Location: Right Arm)   Pulse (!) 111   Temp 98.1 F (36.7 C) (Oral)   Resp 20   LMP 12/20/2021   SpO2 99%   Visual Acuity Right Eye Distance:   Left Eye Distance:   Bilateral Distance:    Right Eye Near:   Left Eye Near:    Bilateral Near:     Physical Exam Vitals and nursing note reviewed.  Constitutional:      General: She is not in acute distress.    Appearance: Normal appearance. She is not ill-appearing or toxic-appearing.  HENT:     Head: Normocephalic and atraumatic.     Right Ear: Tympanic membrane, ear canal and external ear normal.     Left Ear:  Tympanic membrane, ear canal and external ear normal.     Nose: No congestion or rhinorrhea.     Mouth/Throat:     Mouth: Mucous membranes are moist.     Pharynx: Oropharynx is clear. Posterior oropharyngeal erythema present. No oropharyngeal exudate.  Eyes:     General: No scleral icterus.    Extraocular Movements: Extraocular movements intact.  Cardiovascular:     Rate and Rhythm: Normal rate and regular rhythm.  Pulmonary:     Effort: Pulmonary effort is normal. No respiratory distress.     Breath sounds: Normal breath sounds. No wheezing, rhonchi or rales.     Comments: Patient talking in complete sentences in no acute distress, breathing with her mouth closed Abdominal:     General: Abdomen is flat. Bowel sounds are normal. There is no distension.     Palpations: Abdomen is soft.  Musculoskeletal:     Cervical back: Normal range of motion and neck supple.  Lymphadenopathy:     Cervical: Cervical adenopathy present.  Skin:    General: Skin is warm and dry.     Coloration: Skin is not jaundiced or pale.     Findings: No erythema or rash.  Neurological:     Mental Status: She is alert and oriented to person, place, and time.  Psychiatric:        Behavior: Behavior is cooperative.      UC Treatments / Results  Labs (all labs ordered are listed, but only abnormal results are displayed) Labs Reviewed  SARS CORONAVIRUS 2 (TAT 6-24 HRS)    EKG   Radiology No results found.  Procedures Procedures (including critical care time)  Medications Ordered in UC Medications - No data to display  Initial Impression / Assessment and Plan / UC Course  I have reviewed the triage vital signs and the nursing notes.  Pertinent labs & imaging results that were available during my care of the patient were reviewed by me and considered in my medical decision making (see chart for details).   Patient is well-appearing, normotensive, afebrile, not tachycardic, not tachypneic,  oxygenating well on room air.    Viral URI Encounter  for screening for COVID-19 Suspect viral URI as cause of symptoms Patient is in no acute distress, is talking in complete sentences, appears somewhat anxious COVID-19 testing obtained Supportive care discussed EKG today unchanged from last EKG in 10/2021 Reassurance provided  Return and ER precautions discussed  The patient was given the opportunity to ask questions.  All questions answered to their satisfaction.  The patient is in agreement to this plan.    Final Clinical Impressions(s) / UC Diagnoses   Final diagnoses:  Viral URI  Encounter for screening for COVID-19     Discharge Instructions      Your vital signs are excellent today and EKG is unchanged from your last EKG in August.  I think that the pain in your head, breathing trouble, nasal congestion, and other symptoms are secondary to a viral upper respiratory infection.  We have tested you for COVID-19 today.  Your symptoms should not last more than 10 days and they should start to improve during that timeframe.  If you develop severe chest pain or shortness of breath that does not improve with rest, please go to the emergency room or call 911.    You will see the results in Mychart and we will call you with positive results.    Please stay home and isolate until you are aware of the results.    Some things that can make you feel better are: - Increased rest - Increasing fluid with water/sugar free electrolytes - Acetaminophen and ibuprofen as needed for fever/pain  - Salt water gargling, chloraseptic spray and throat lozenges - OTC guaifenesin (Mucinex) for congestion - Saline sinus flushes or a neti pot  - Humidifying the air     ED Prescriptions   None    PDMP not reviewed this encounter.   Valentino Nose, NP 01/06/22 1456

## 2022-01-07 LAB — SARS CORONAVIRUS 2 (TAT 6-24 HRS): SARS Coronavirus 2: NEGATIVE

## 2022-01-11 DIAGNOSIS — Z319 Encounter for procreative management, unspecified: Secondary | ICD-10-CM | POA: Diagnosis not present

## 2022-01-11 DIAGNOSIS — R350 Frequency of micturition: Secondary | ICD-10-CM | POA: Diagnosis not present

## 2022-01-11 DIAGNOSIS — E282 Polycystic ovarian syndrome: Secondary | ICD-10-CM | POA: Diagnosis not present

## 2022-02-17 DIAGNOSIS — J020 Streptococcal pharyngitis: Secondary | ICD-10-CM | POA: Diagnosis not present

## 2022-02-17 DIAGNOSIS — N898 Other specified noninflammatory disorders of vagina: Secondary | ICD-10-CM | POA: Diagnosis not present

## 2022-02-22 DIAGNOSIS — Z3141 Encounter for fertility testing: Secondary | ICD-10-CM | POA: Diagnosis not present

## 2022-03-20 DIAGNOSIS — Z3149 Encounter for other procreative investigation and testing: Secondary | ICD-10-CM | POA: Diagnosis not present

## 2022-04-17 DIAGNOSIS — J069 Acute upper respiratory infection, unspecified: Secondary | ICD-10-CM | POA: Diagnosis not present

## 2022-04-18 DIAGNOSIS — Z3149 Encounter for other procreative investigation and testing: Secondary | ICD-10-CM | POA: Diagnosis not present

## 2022-05-17 DIAGNOSIS — Z3149 Encounter for other procreative investigation and testing: Secondary | ICD-10-CM | POA: Diagnosis not present

## 2022-05-22 DIAGNOSIS — Z3183 Encounter for assisted reproductive fertility procedure cycle: Secondary | ICD-10-CM | POA: Diagnosis not present

## 2022-06-14 DIAGNOSIS — Z3149 Encounter for other procreative investigation and testing: Secondary | ICD-10-CM | POA: Diagnosis not present

## 2022-09-20 DIAGNOSIS — F418 Other specified anxiety disorders: Secondary | ICD-10-CM | POA: Diagnosis not present

## 2022-10-23 DIAGNOSIS — F418 Other specified anxiety disorders: Secondary | ICD-10-CM | POA: Diagnosis not present

## 2022-10-23 DIAGNOSIS — Z79899 Other long term (current) drug therapy: Secondary | ICD-10-CM | POA: Diagnosis not present

## 2022-10-24 DIAGNOSIS — Z79899 Other long term (current) drug therapy: Secondary | ICD-10-CM | POA: Diagnosis not present

## 2022-10-24 DIAGNOSIS — F411 Generalized anxiety disorder: Secondary | ICD-10-CM | POA: Diagnosis not present

## 2022-11-01 DIAGNOSIS — E663 Overweight: Secondary | ICD-10-CM | POA: Diagnosis not present

## 2022-11-01 DIAGNOSIS — J302 Other seasonal allergic rhinitis: Secondary | ICD-10-CM | POA: Diagnosis not present

## 2022-11-01 DIAGNOSIS — Z6825 Body mass index (BMI) 25.0-25.9, adult: Secondary | ICD-10-CM | POA: Diagnosis not present

## 2023-01-23 DIAGNOSIS — Z0001 Encounter for general adult medical examination with abnormal findings: Secondary | ICD-10-CM | POA: Diagnosis not present

## 2023-01-31 DIAGNOSIS — F329 Major depressive disorder, single episode, unspecified: Secondary | ICD-10-CM | POA: Diagnosis not present

## 2023-01-31 DIAGNOSIS — R000 Tachycardia, unspecified: Secondary | ICD-10-CM | POA: Diagnosis not present

## 2023-01-31 DIAGNOSIS — F411 Generalized anxiety disorder: Secondary | ICD-10-CM | POA: Diagnosis not present

## 2023-01-31 DIAGNOSIS — Z0000 Encounter for general adult medical examination without abnormal findings: Secondary | ICD-10-CM | POA: Diagnosis not present

## 2023-01-31 DIAGNOSIS — Z319 Encounter for procreative management, unspecified: Secondary | ICD-10-CM | POA: Diagnosis not present

## 2023-02-19 DIAGNOSIS — Z319 Encounter for procreative management, unspecified: Secondary | ICD-10-CM | POA: Diagnosis not present

## 2023-02-26 DIAGNOSIS — Z3149 Encounter for other procreative investigation and testing: Secondary | ICD-10-CM | POA: Diagnosis not present

## 2023-02-26 DIAGNOSIS — Z319 Encounter for procreative management, unspecified: Secondary | ICD-10-CM | POA: Diagnosis not present

## 2023-03-27 DIAGNOSIS — J069 Acute upper respiratory infection, unspecified: Secondary | ICD-10-CM | POA: Diagnosis not present

## 2023-07-02 DIAGNOSIS — H9209 Otalgia, unspecified ear: Secondary | ICD-10-CM | POA: Diagnosis not present

## 2023-07-02 DIAGNOSIS — H9202 Otalgia, left ear: Secondary | ICD-10-CM | POA: Diagnosis not present

## 2023-12-04 DIAGNOSIS — J0100 Acute maxillary sinusitis, unspecified: Secondary | ICD-10-CM | POA: Diagnosis not present

## 2023-12-04 DIAGNOSIS — Z79899 Other long term (current) drug therapy: Secondary | ICD-10-CM | POA: Diagnosis not present

## 2023-12-04 DIAGNOSIS — J302 Other seasonal allergic rhinitis: Secondary | ICD-10-CM | POA: Diagnosis not present

## 2024-01-10 DIAGNOSIS — H9203 Otalgia, bilateral: Secondary | ICD-10-CM | POA: Diagnosis not present

## 2024-01-10 DIAGNOSIS — J329 Chronic sinusitis, unspecified: Secondary | ICD-10-CM | POA: Diagnosis not present

## 2024-01-10 DIAGNOSIS — B9689 Other specified bacterial agents as the cause of diseases classified elsewhere: Secondary | ICD-10-CM | POA: Diagnosis not present

## 2024-01-29 DIAGNOSIS — Z131 Encounter for screening for diabetes mellitus: Secondary | ICD-10-CM | POA: Diagnosis not present

## 2024-01-29 DIAGNOSIS — Z0000 Encounter for general adult medical examination without abnormal findings: Secondary | ICD-10-CM | POA: Diagnosis not present

## 2024-02-04 DIAGNOSIS — Z0001 Encounter for general adult medical examination with abnormal findings: Secondary | ICD-10-CM | POA: Diagnosis not present

## 2024-02-04 DIAGNOSIS — Z319 Encounter for procreative management, unspecified: Secondary | ICD-10-CM | POA: Diagnosis not present

## 2024-02-04 DIAGNOSIS — Z6841 Body Mass Index (BMI) 40.0 and over, adult: Secondary | ICD-10-CM | POA: Diagnosis not present

## 2024-02-04 DIAGNOSIS — R000 Tachycardia, unspecified: Secondary | ICD-10-CM | POA: Diagnosis not present

## 2024-02-04 DIAGNOSIS — E669 Obesity, unspecified: Secondary | ICD-10-CM | POA: Diagnosis not present

## 2024-02-04 DIAGNOSIS — F329 Major depressive disorder, single episode, unspecified: Secondary | ICD-10-CM | POA: Diagnosis not present

## 2024-02-04 DIAGNOSIS — F411 Generalized anxiety disorder: Secondary | ICD-10-CM | POA: Diagnosis not present

## 2024-02-04 DIAGNOSIS — Z23 Encounter for immunization: Secondary | ICD-10-CM | POA: Diagnosis not present
